# Patient Record
Sex: Male | Born: 1980 | Race: White | Hispanic: No | Marital: Married | Smoking: Current every day smoker
Health system: Southern US, Community
[De-identification: ages and names within clinical notes are randomized; demographics above are authoritative.]

## PROBLEM LIST (undated history)

## (undated) DIAGNOSIS — I10 Essential (primary) hypertension: Secondary | ICD-10-CM

## (undated) DIAGNOSIS — B019 Varicella without complication: Secondary | ICD-10-CM

## (undated) HISTORY — DX: Varicella without complication: B01.9

## (undated) HISTORY — PX: NO PAST SURGERIES: SHX2092

---

## 2016-01-17 ENCOUNTER — Encounter: Payer: Self-pay | Admitting: Emergency Medicine

## 2016-01-17 ENCOUNTER — Emergency Department
Admission: EM | Admit: 2016-01-17 | Discharge: 2016-01-17 | Disposition: A | Discharge status: Home / Self Care | Payer: 59 | Attending: Emergency Medicine | Admitting: Emergency Medicine

## 2016-01-17 ENCOUNTER — Emergency Department: Payer: 59

## 2016-01-17 DIAGNOSIS — R0789 Other chest pain: Secondary | ICD-10-CM | POA: Diagnosis present

## 2016-01-17 DIAGNOSIS — R079 Chest pain, unspecified: Secondary | ICD-10-CM | POA: Diagnosis not present

## 2016-01-17 DIAGNOSIS — I10 Essential (primary) hypertension: Secondary | ICD-10-CM | POA: Diagnosis not present

## 2016-01-17 DIAGNOSIS — Z87891 Personal history of nicotine dependence: Secondary | ICD-10-CM | POA: Diagnosis not present

## 2016-01-17 HISTORY — DX: Essential (primary) hypertension: I10

## 2016-01-17 LAB — COMPREHENSIVE METABOLIC PANEL
ALT: 29 U/L (ref 17–63)
AST: 26 U/L (ref 15–41)
Albumin: 3.9 g/dL (ref 3.5–5.0)
Alkaline Phosphatase: 60 U/L (ref 38–126)
Anion gap: 7 (ref 5–15)
BUN: 11 mg/dL (ref 6–20)
CHLORIDE: 105 mmol/L (ref 101–111)
CO2: 25 mmol/L (ref 22–32)
CREATININE: 1.06 mg/dL (ref 0.61–1.24)
Calcium: 9.1 mg/dL (ref 8.9–10.3)
GFR calc Af Amer: >60 mL/min (ref >60)
GFR calc non Af Amer: >60 mL/min (ref >60)
GLUCOSE: 96 mg/dL (ref 65–99)
Potassium: 3.9 mmol/L (ref 3.5–5.1)
SODIUM: 137 mmol/L (ref 135–145)
Total Bilirubin: 0.5 mg/dL (ref 0.3–1.2)
Total Protein: 7.8 g/dL (ref 6.5–8.1)

## 2016-01-17 LAB — CBC
HCT: 45.7 % (ref 40.0–52.0)
Hemoglobin: 15.8 g/dL (ref 13.0–18.0)
MCH: 28.5 pg (ref 26.0–34.0)
MCHC: 34.5 g/dL (ref 32.0–36.0)
MCV: 82.5 fL (ref 80.0–100.0)
Platelets: 286 10*3/uL (ref 150–440)
RBC: 5.53 MIL/uL (ref 4.40–5.90)
RDW: 13.8 % (ref 11.5–14.5)
WBC: 10.4 10*3/uL (ref 3.8–10.6)

## 2016-01-17 LAB — TROPONIN I
Troponin I: <0.03 ng/mL (ref <0.03)
Troponin I: <0.03 ng/mL (ref <0.03)

## 2016-01-17 MED ORDER — SODIUM CHLORIDE 0.9 % IV BOLUS (SEPSIS)
1000.0000 mL | Freq: Once | INTRAVENOUS | Status: AC
  Administered 2016-01-17: 1000 mL via INTRAVENOUS

## 2016-01-17 NOTE — ED Notes (Signed)
Dr. Brown in with pt.

## 2016-01-17 NOTE — ED Provider Notes (Signed)
[facility] Emergency Department Provider Note    First MD Initiated Contact with Patient 01/17/16 1249     (approximate)  I have reviewed the triage vital signs and the nursing notes.   HISTORY  Chief Complaint Chest Pain  HPI Victor Cuevas is a 35 y.o. male no past medical history presents to the emergency department with chest pressure with radiation to left arm today. Patient states that his initial pain score was proximal my 7 out of 10 but following submental nitroglycerin was decreased to a 2 right now. Patient is also given aspirin 324 mg by EMS. Patient also admits to nausea and one episode of nonbloody diarrhea.  Past Medical History:  Diagnosis Date  . Hypertension     There are no active problems to display for this patient.   History reviewed. No pertinent surgical history.  Prior to Admission medications   Not on File    Allergies Patient has no known allergies.  No family history on file.  Social History Social History  Substance Use Topics  . Smoking status: Former Research scientist (life sciences)  . Smokeless tobacco: Never Used  . Alcohol use No    Review of Systems Constitutional: No fever/chills Eyes: No visual changes. ENT: No sore throat. Cardiovascular: Positive for chest pain. Respiratory: Denies shortness of breath. Gastrointestinal: No abdominal pain.  No nausea, no vomiting.  No diarrhea.  No constipation. Genitourinary: Negative for dysuria. Musculoskeletal: Negative for back pain. Skin: Negative for rash. Neurological: Negative for headaches, focal weakness or numbness.  10-point ROS otherwise negative.  ____________________________________________   PHYSICAL EXAM:  VITAL SIGNS: ED Triage Vitals  Enc Vitals Group     BP 01/17/16 1152 137/71     Pulse Rate 01/17/16 1152 (!) 113     Resp 01/17/16 1152 20     Temp 01/17/16 1152 98.3 F (36.8 C)     Temp Source 01/17/16 1152 Oral     SpO2 01/17/16 1149 97 %     Weight  01/17/16 1153 275 lb (124.7 kg)     Height 01/17/16 1153 5\' 11"  (1.803 m)     Head Circumference --      Peak Flow --      Pain Score 01/17/16 1153 4     Pain Loc --      Pain Edu? --      Excl. in [place]? --     Constitutional: Alert and oriented. Well appearing and in no acute distress. Eyes: Conjunctivae are normal. PERRL. EOMI. Head: Atraumatic. Nose: No congestion/rhinnorhea. Mouth/Throat: Mucous membranes are moist.  Oropharynx non-erythematous. Neck: No stridor.  No meningeal signs.  Cardiovascular: Normal rate, regular rhythm. Good peripheral circulation. Grossly normal heart sounds. Respiratory: Normal respiratory effort.  No retractions. Lungs CTAB. Gastrointestinal: Soft and nontender. No distention.   Musculoskeletal: No lower extremity tenderness nor edema. No gross deformities of extremities. Neurologic:  Normal speech and language. No gross focal neurologic deficits are appreciated.  Skin:  Skin is warm, dry and intact. No rash noted. Psychiatric: Mood and affect are normal. Speech and behavior are normal.  ____________________________________________   LABS (all labs ordered are listed, but only abnormal results are displayed)  Labs Reviewed  CBC  TROPONIN I  COMPREHENSIVE METABOLIC PANEL   ____________________________________________  EKG  ED ECG REPORT I, [place] N BROWN, the attending physician, personally viewed and interpreted this ECG.   Date: 01/19/2016  EKG Time: 11:45 AM  Rate: 91  Rhythm: Normal sinus rhythm  Axis: Normal  Intervals: Normal  ST&T Change: None  ____________________________________________  RADIOLOGY I, [place] N BROWN, personally viewed and evaluated these images (plain radiographs) as part of my medical decision making, as well as reviewing the written report by the radiologist.  Dg Chest 2 View  Result Date: 01/17/2016 CLINICAL DATA:  Chest pressure since last night. No shortness of breath. Symptoms associated with  some nausea. Former smoker. EXAM: CHEST  2 VIEW COMPARISON:  None in PACs FINDINGS: The lungs are well-expanded and clear. The heart and pulmonary vascularity are normal. The mediastinum is normal in width the retrosternal soft tissues are normal. There is no pleural effusion. The trachea is midline. The bony thorax exhibits no acute abnormality. IMPRESSION: There is no active cardiopulmonary disease. Electronically Signed   By: David  Martinique M.D.   On: 01/17/2016 13:15     Procedures    INITIAL IMPRESSION / ASSESSMENT AND PLAN / ED COURSE  Pertinent labs & imaging results that were available during my care of the patient were reviewed by me and considered in my medical decision making (see chart for details).     Clinical Course    Patient presenting with chest pain which she believes may be secondary to "stress". EKG revealed no evidence of ischemia or infarction troponin negative. Will obtain a second troponin. Patient's care transferred to Dr. Goodman____________________________________________  FINAL CLINICAL IMPRESSION(S) / ED DIAGNOSES  Final diagnoses:  Chest pain, unspecified type     MEDICATIONS GIVEN DURING THIS VISIT:  Medications - No data to display   NEW OUTPATIENT MEDICATIONS STARTED DURING THIS VISIT:  New Prescriptions   No medications on file    Modified Medications   No medications on file    Discontinued Medications   No medications on file     Note:  This document was prepared using Dragon voice recognition software and may include unintentional dictation errors.    Gregor Hams, MD 01/19/16 [phone]

## 2016-01-17 NOTE — ED Triage Notes (Signed)
Patient to ER via EMS from home for c/o chest pressure that began last night. Patient was driving when nausea began, followed by central chest pain. Denies diaphoresis, dizziness, or HA. Patient was given 324mg  ASA and 2 SL nitro via EMS. Patient's systolic pressure was in [id number] prior to nitro. Patient reports some relief from nitro (was 6-7/10 on pain scale, now 4-5 on pain scale). Patient denies any cardiac history or any family history at young age.

## 2016-01-17 NOTE — ED Notes (Signed)
See triage note  He was brought to ED via ems with some chest discomfort   States chest discomfort was in central chest following some nausea while driving

## 2016-01-17 NOTE — ED Notes (Signed)
Resting quietly at present  Awaiting

## 2016-04-14 ENCOUNTER — Encounter: Payer: Self-pay | Admitting: Family Medicine

## 2016-04-14 ENCOUNTER — Ambulatory Visit (INDEPENDENT_AMBULATORY_CARE_PROVIDER_SITE_OTHER): Payer: BLUE CROSS/BLUE SHIELD | Admitting: Family Medicine

## 2016-04-14 VITALS — BP 108/78 | HR 68 | Temp 97.6°F | Ht 71.0 in | Wt 225.0 lb

## 2016-04-14 DIAGNOSIS — R200 Anesthesia of skin: Secondary | ICD-10-CM | POA: Diagnosis not present

## 2016-04-14 DIAGNOSIS — R1013 Epigastric pain: Secondary | ICD-10-CM | POA: Diagnosis not present

## 2016-04-14 DIAGNOSIS — R109 Unspecified abdominal pain: Secondary | ICD-10-CM | POA: Diagnosis not present

## 2016-04-14 DIAGNOSIS — R634 Abnormal weight loss: Secondary | ICD-10-CM | POA: Diagnosis not present

## 2016-04-14 LAB — COMPREHENSIVE METABOLIC PANEL
ALK PHOS: 75 U/L (ref 39–117)
ALT: 13 U/L (ref 0–53)
AST: 13 U/L (ref 0–37)
Albumin: 4.1 g/dL (ref 3.5–5.2)
BUN: 11 mg/dL (ref 6–23)
CALCIUM: 9.5 mg/dL (ref 8.4–10.5)
CO2: 26 meq/L (ref 19–32)
Chloride: 104 mEq/L (ref 96–112)
Creatinine, Ser: 1.04 mg/dL (ref 0.40–1.50)
GFR: 86.88 mL/min (ref >60.00)
GLUCOSE: 81 mg/dL (ref 70–99)
POTASSIUM: 4.3 meq/L (ref 3.5–5.1)
Sodium: 139 mEq/L (ref 135–145)
Total Bilirubin: 0.5 mg/dL (ref 0.2–1.2)
Total Protein: 7.2 g/dL (ref 6.0–8.3)

## 2016-04-14 LAB — POCT URINALYSIS DIPSTICK
Bilirubin, UA: NEGATIVE
Glucose, UA: NEGATIVE
KETONES UA: NEGATIVE
Leukocytes, UA: NEGATIVE
Nitrite, UA: NEGATIVE
PH UA: 6
PROTEIN UA: 15
Urobilinogen, UA: 0.2

## 2016-04-14 LAB — TSH: TSH: 2.91 u[IU]/mL (ref 0.35–4.50)

## 2016-04-14 LAB — URINALYSIS, MICROSCOPIC ONLY: WBC, UA: NONE SEEN (ref 0–?)

## 2016-04-14 LAB — HEMOGLOBIN A1C: HEMOGLOBIN A1C: 5.4 % (ref 4.6–6.5)

## 2016-04-14 LAB — CBC
HEMATOCRIT: 46.3 % (ref 39.0–52.0)
HEMOGLOBIN: 15.6 g/dL (ref 13.0–17.0)
MCHC: 33.7 g/dL (ref 30.0–36.0)
MCV: 85.6 fl (ref 78.0–100.0)
Platelets: 238 10*3/uL (ref 150.0–400.0)
RBC: 5.42 Mil/uL (ref 4.22–5.81)
RDW: 13.5 % (ref 11.5–15.5)
WBC: 6.3 10*3/uL (ref 4.0–10.5)

## 2016-04-14 LAB — SEDIMENTATION RATE: SED RATE: 36 mm/h — AB (ref 0–15)

## 2016-04-14 LAB — LIPASE: LIPASE: 42 U/L (ref 11.0–59.0)

## 2016-04-14 NOTE — Patient Instructions (Signed)
Nice to meet you. We are going to obtain some lab work and send your urine for culture and microscopy. Once the lab work returns we'll have a better answer as how to proceed regarding your pain. If you develop worsening pain, persistent numbness, weakness, blood in your stool, fevers, or any new or changing symptoms please seek medical attention immediately.

## 2016-04-14 NOTE — Progress Notes (Signed)
Pre visit review using our clinic review tool, if applicable. No additional management support is needed unless otherwise documented below in the visit note. 

## 2016-04-14 NOTE — Progress Notes (Signed)
Victor Rumps, MD Phone: [phone]  Victor Cuevas is a 36 y.o. male who presents today for new patient visit.  Patient reports over the last 3-4 months he has had abdominal pain that has now localized to his right flank. He notes initially he had some chest pain with this and was evaluated in the emergency room. They ruled out MI there. He's not had any recurrence of the chest pain. He notes no radiation of the flank pain. He notes no numbness or weakness in his lower extremities. Does note his left upper extremity falls asleep while he is sleeping at times. Resolves with change in position. Has no numbness at any other time. He notes no dysuria, frequency, or urgency of urine. 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 He notes intermittent diarrhea with no blood in his stool. Has not had any nausea in the last 3-4 months. No vomiting. The flank pain and abdominal pain occurs typically after eating. Sometimes gets pain across the epigastric region of the stomach. It is somewhere between sharp and stabbing pain and dull pain. Last for about 30 minutes and goes away on its own. He notes he has lost about 50 pounds not necessarily unintentionally though he did change his diet significantly given the pain issues that he's had. He notes no night sweats. His father does have a history of kidney stones.  Active Ambulatory Problems    Diagnosis Date Noted  . Right flank pain 04/14/2016  . Left arm numbness 04/14/2016   Resolved Ambulatory Problems    Diagnosis Date Noted  . No Resolved Ambulatory Problems   Past Medical History:  Diagnosis Date  . Chicken pox   . Hypertension     Family History  Problem Relation Age of Onset  . Hypertension Father   . Diabetes Father   . Hypertension Maternal Grandfather   . Diabetes Maternal Grandfather     Social History   Social History  . Marital status: Single    Spouse name: N/A  . Number of children: N/A  . Years of education: N/A     Occupational History  . Not on file.   Social History Main Topics  . Smoking status: Former Research scientist (life sciences)  . Smokeless tobacco: Never Used  . Alcohol use No  . Drug use: No  . Sexual activity: Not on file   Other Topics Concern  . Not on file   Social History Narrative  . No narrative on file    ROS see HPI  Objective  Physical Exam Vitals:   04/14/16 0818 04/14/16 0901  BP: (!) 124/94 108/78  Pulse: 68   Temp: 97.6 F (36.4 C)     BP Readings from Last 3 Encounters:  04/14/16 108/78  01/17/16 135/81   Wt Readings from Last 3 Encounters:  04/14/16 225 lb (102.1 kg)  01/17/16 275 lb (124.7 kg)    Physical Exam  Constitutional: No distress.  HENT:  Head: Normocephalic and atraumatic.  Mouth/Throat: Oropharynx is clear and moist. No oropharyngeal exudate.  Eyes: Conjunctivae are normal. Pupils are equal, round, and reactive to light.  Cardiovascular: Normal rate, regular rhythm and normal heart sounds.   Pulmonary/Chest: Effort normal and breath sounds normal.  Abdominal: Soft. Bowel sounds are normal. He exhibits no distension. There is tenderness (mild epigastric tenderness). There is no rebound and no guarding.  Negative Murphy's sign  Musculoskeletal: He exhibits no edema.  Neurological: He is alert. Gait normal.  CN 2-12 intact, 5/5 strength in  bilateral biceps, triceps, grip, quads, hamstrings, plantar and dorsiflexion, sensation to light touch intact in bilateral UE and LE, normal gait, 2+ patellar reflexes  Skin: Skin is warm and dry. He is not diaphoretic.     Assessment/Plan:   Right flank pain Patient notes right flank pain that has sometimes been associated with epigastric discomfort. This pain is associated with eating meals. Initially started after having had some abdominal discomfort and chest pain that was evaluated in the emergency room. Also associated with weight loss though potentially this could be related to his change in diet. Given  association with eating this may be related to a gallbladder issue or a gastric ulcer issue. Given location could be kidney stone though this seems less likely given association with food intake. He does have blood on his UA dipstick. We will cast a wide net in evaluation. Lab work will be obtained as outlined below. Send urine for culture and microscopy. He'll monitor and if worsens in any manner seek medical attention immediately.   Left arm numbness Positional left arm numbness only when sleeping. Doesn't occur at any other time. When he gets up it goes away quickly. He is neurologically intact. No neck pain. Discussed this is likely positional from how he is sleeping given his description. He'll continue to monitor and if worsens or changes in any manner he will let us know. Could consider x-ray of his neck if this persists.   Orders Placed This Encounter  Procedures  . Urine Culture  . Urine Microscopic Only  . CBC  . Comp Met (CMET)  . TSH  . HgB A1c  . Sed Rate (ESR)  . Lipase  . POCT Urinalysis Dipstick    No orders of the defined types were placed in this encounter.    Victor Rumps, MD [facility]

## 2016-04-14 NOTE — Assessment & Plan Note (Signed)
Patient notes right flank pain that has sometimes been associated with epigastric discomfort. This pain is associated with eating meals. Initially started after having had some abdominal discomfort and chest pain that was evaluated in the emergency room. Also associated with weight loss though potentially this could be related to his change in diet. Given association with eating this may be related to a gallbladder issue or a gastric ulcer issue. Given location could be kidney stone though this seems less likely given association with food intake. He does have blood on his UA dipstick. We will cast a wide net in evaluation. Lab work will be obtained as outlined below. Send urine for culture and microscopy. He'll monitor and if worsens in any manner seek medical attention immediately.

## 2016-04-14 NOTE — Assessment & Plan Note (Signed)
Positional left arm numbness only when sleeping. Doesn't occur at any other time. When he gets up it goes away quickly. He is neurologically intact. No neck pain. Discussed this is likely positional from how he is sleeping given his description. He'll continue to monitor and if worsens or changes in any manner he will let us know. Could consider x-ray of his neck if this persists.

## 2016-04-16 LAB — URINE CULTURE: ORGANISM ID, BACTERIA: NO GROWTH

## 2016-04-18 ENCOUNTER — Telehealth: Payer: Self-pay

## 2016-04-18 DIAGNOSIS — R3129 Other microscopic hematuria: Secondary | ICD-10-CM

## 2016-04-18 DIAGNOSIS — G8929 Other chronic pain: Secondary | ICD-10-CM

## 2016-04-18 DIAGNOSIS — R1013 Epigastric pain: Secondary | ICD-10-CM

## 2016-04-18 NOTE — Telephone Encounter (Signed)
-----   Message from Leone Haven, MD sent at 04/18/2016  1:40 PM EST ----- Please let the patient know that his lab work revealed a mildly elevated sedimentation rate though otherwise was unremarkable. His urine did have red blood cells in it and I would suggest referral to urology for that. Given the chronicity of his abdominal pain I would suggest evaluation by GI as well. If he is willing I can place these referrals. Thanks.

## 2016-04-18 NOTE — Telephone Encounter (Signed)
lmtrc

## 2016-04-19 NOTE — Telephone Encounter (Signed)
Patient advised of below and verbalized understanding.  He is willing to go to urology and GI please place order for referrals. Thanks

## 2016-04-19 NOTE — Telephone Encounter (Signed)
Referrals placed 

## 2016-05-01 ENCOUNTER — Ambulatory Visit: Payer: Self-pay | Admitting: Urology

## 2016-05-02 NOTE — Progress Notes (Signed)
05/03/2016 4:27 PM   Silver Huguenin 1980-02-25 [id number]  Referring provider: Leone Haven, MD [address] [place], [place] [address]  Chief Complaint  Patient presents with  . New Patient (Initial Visit)    microscopic hematuria referred by Dr. Charlesetta Shanks    HPI: Patient is a 36 -year-old Caucasian male who presents today as a referral from their PCP, Dr. Caryl Bis, for microscopic hematuria.    Patient was found to have microscopic hematuria on 04/19/2016 with 3-6 RBC's/hpf and negative urine culture.  Patient does have a prior history of microscopic hematuria, but he did not follow up.    He does not have a prior history of recurrent urinary tract infections, nephrolithiasis, trauma to the genitourinary tract, BPH or malignancies of the genitourinary tract.   He does have a family medical history of nephrolithiasis with his dad and brother having stones.  He has not had malignancies of the genitourinary tract or hematuria.   Today, she is not having symptoms of frequent urination, urgency, dysuria, nocturia, incontinence, hesitancy, intermittency, straining to urinate or a weak urinary stream.  His UA today is unremarkable.   He is not experiencing any suprapubic pain, but he is experiencing right upper quadrant pain.  He denies any recent fevers, chills, nausea or vomiting.   He has not had any recent imaging studies.   He is a former smoker, with a 2 ppd history.  Quit 10 years ago.  He is not exposed to secondhand smoke.  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   His BMI is 30.52.     PMH: Past Medical History:  Diagnosis Date  . Chicken pox   . Hypertension     Surgical History: History reviewed. No pertinent surgical history.  Home Medications:  Allergies as of 05/03/2016   No Known Allergies     Medication List    as of 05/03/2016  4:27 PM   You have not been prescribed any medications.     Allergies: No Known Allergies  Family  History: Family History  Problem Relation Age of Onset  . Hypertension Father   . Diabetes Father   . Hypertension Maternal Grandfather   . Diabetes Maternal Grandfather   . Lymphoma Mother   . Prostate cancer Neg Hx   . Kidney cancer Neg Hx   . Bladder Cancer Neg Hx     Social History:  reports that he has quit smoking. He has never used smokeless tobacco. He reports that he does not drink alcohol or use drugs.  ROS: UROLOGY Frequent Urination?: No Hard to postpone urination?: No Burning/pain with urination?: No Get up at night to urinate?: No Leakage of urine?: No Urine stream starts and stops?: No Trouble starting stream?: No Do you have to strain to urinate?: No Blood in urine?: Yes Urinary tract infection?: No Sexually transmitted disease?: No Injury to kidneys or bladder?: No Painful intercourse?: No Weak stream?: No Erection problems?: No Penile pain?: No  Gastrointestinal Nausea?: No Vomiting?: No Indigestion/heartburn?: No Diarrhea?: Yes Constipation?: Yes  Constitutional Fever: No Night sweats?: No Weight loss?: Yes Fatigue?: Yes  Skin Skin rash/lesions?: No Itching?: No  Eyes Blurred vision?: No Double vision?: No  Ears/Nose/Throat Sore throat?: Yes Sinus problems?: Yes  Hematologic/Lymphatic Swollen glands?: No Easy bruising?: No  Cardiovascular Leg swelling?: No Chest pain?: No  Respiratory Cough?: No Shortness of breath?: No  Endocrine Excessive thirst?: No  Musculoskeletal Back pain?: Yes Joint pain?: No  Neurological Headaches?: No  Dizziness?: No  Psychologic Depression?: No Anxiety?: No  Physical Exam: BP 118/81   Pulse (!) 57   Ht 5\' 11"  (1.803 m)   Wt 218 lb 12.8 oz (99.2 kg)   BMI 30.52 kg/m   Constitutional: Well nourished. Alert and oriented, No acute distress. HEENT: [place] AT, moist mucus membranes. Trachea midline, no masses. Cardiovascular: No clubbing, cyanosis, or edema. Respiratory: Normal  respiratory effort, no increased work of breathing. GI: Abdomen is soft, non tender, non distended, no abdominal masses. Liver and spleen not palpable.  No hernias appreciated.  Stool sample for occult testing is not indicated.   GU: No CVA tenderness.  No bladder fullness or masses.  Patient with circumcised phallus.   Urethral meatus is patent.  No penile discharge. No penile lesions or rashes. Scrotum without lesions, cysts, rashes and/or edema.  Testicles are located scrotally bilaterally. No masses are appreciated in the testicles. Left and right epididymis are normal. Rectal: Patient with  normal sphincter tone. Anus and perineum without scarring or rashes. No rectal masses are appreciated. Prostate is approximately 45 grams, no nodules are appreciated. Seminal vesicles are normal. Skin: No rashes, bruises or suspicious lesions. Lymph: No cervical or inguinal adenopathy. Neurologic: Grossly intact, no focal deficits, moving all 4 extremities. Psychiatric: Normal mood and affect.  Laboratory Data: Lab Results  Component Value Date   WBC 6.3 04/14/2016   HGB 15.6 04/14/2016   HCT 46.3 04/14/2016   MCV 85.6 04/14/2016   PLT 238.0 04/14/2016    Lab Results  Component Value Date   CREATININE 1.04 04/14/2016     Lab Results  Component Value Date   HGBA1C 5.4 04/14/2016    Lab Results  Component Value Date   TSH 2.91 04/14/2016     Lab Results  Component Value Date   AST 13 04/14/2016   Lab Results  Component Value Date   ALT 13 04/14/2016      Urinalysis Unremarkable.  See EPIC.    Assessment & Plan:    1. Microscopic hematuria  - I explained to the patient that there are a number of causes that can be associated with blood in the urine, such as stones,  BPH, UTI's, damage to the urinary tract and/or cancer.  - At this time, I felt that the patient warranted further urologic evaluation.   The AUA guidelines state that a CT urogram is the preferred imaging study  to evaluate hematuria.  - I explained to the patient that a contrast material will be injected into a vein and that in rare instances, an allergic reaction can result and may even life threatening   The patient denies any allergies to contrast, iodine and/or seafood and is not taking metformin.  - Following the imaging study,  I've recommended a cystoscopy. I described how this is performed, typically in an office setting with a flexible cystoscope. We described the risks, benefits, and possible side effects, the most common of which is a minor amount of blood in the urine and/or burning which usually resolves in 24 to 48 hours.    - The patient had the opportunity to ask questions which were answered. Based upon this discussion, the patient is willing to proceed. Therefore, I've ordered: a CT Urogram and cystoscopy.  - The patient will return following all of the above for discussion of the results.   - UA  - Urine culture  - BUN + creatinine    Return for CT Urogram report and cystoscopy.  These notes generated with voice recognition software. I apologize for typographical errors.  Zara Council, [facility] [address], [place], [place] [address] [phone]

## 2016-05-03 ENCOUNTER — Encounter: Payer: Self-pay | Admitting: Urology

## 2016-05-03 ENCOUNTER — Ambulatory Visit: Payer: BLUE CROSS/BLUE SHIELD | Admitting: Urology

## 2016-05-03 VITALS — BP 118/81 | HR 57 | Ht 71.0 in | Wt 218.8 lb

## 2016-05-03 DIAGNOSIS — R3129 Other microscopic hematuria: Secondary | ICD-10-CM | POA: Diagnosis not present

## 2016-05-03 LAB — URINALYSIS, COMPLETE
BILIRUBIN UA: NEGATIVE
Glucose, UA: NEGATIVE
Leukocytes, UA: NEGATIVE
NITRITE UA: NEGATIVE
Urobilinogen, Ur: 0.2 mg/dL (ref 0.2–1.0)
pH, UA: 6.5 (ref 5.0–7.5)

## 2016-05-03 LAB — MICROSCOPIC EXAMINATION
Bacteria, UA: NONE SEEN
EPITHELIAL CELLS (NON RENAL): NONE SEEN /HPF (ref 0–10)
WBC, UA: NONE SEEN /hpf (ref 0–?)

## 2016-05-03 NOTE — Patient Instructions (Addendum)
Hematuria, Adult Hematuria is blood in your urine. It can be caused by a bladder infection, kidney infection, prostate infection, kidney stone, or cancer of your urinary tract. Infections can usually be treated with medicine, and a kidney stone usually will pass through your urine. If neither of these is the cause of your hematuria, further workup to find out the reason may be needed. It is very important that you tell your health care provider about any blood you see in your urine, even if the blood stops without treatment or happens without causing pain. Blood in your urine that happens and then stops and then happens again can be a symptom of a very serious condition. Also, pain is not a symptom in the initial stages of many urinary cancers. Follow these instructions at home:  Drink lots of fluid, 3-4 quarts a day. If you have been diagnosed with an infection, cranberry juice is especially recommended, in addition to large amounts of water.  Avoid caffeine, tea, and carbonated beverages because they tend to irritate the bladder.  Avoid alcohol because it may irritate the prostate.  Take all medicines as directed by your health care provider.  If you were prescribed an antibiotic medicine, finish it all even if you start to feel better.  If you have been diagnosed with a kidney stone, follow your health care provider's instructions regarding straining your urine to catch the stone.  Empty your bladder often. Avoid holding urine for long periods of time.  After a bowel movement, women should cleanse front to back. Use each tissue only once.  Empty your bladder before and after sexual intercourse if you are a male. Contact a health care provider if:  You develop back pain.  You have a fever.  You have a feeling of sickness in your stomach (nausea) or vomiting.  Your symptoms are not better in 3 days. Return sooner if you are getting worse. Get help right away if:  You develop  severe vomiting and are unable to keep the medicine down.  You develop severe back or abdominal pain despite taking your medicines.  You begin passing a large amount of blood or clots in your urine.  You feel extremely weak or faint, or you pass out. This information is not intended to replace advice given to you by your health care provider. Make sure you discuss any questions you have with your health care provider. Document Released: 02/06/2005 Document Revised: 07/15/2015 Document Reviewed: 10/07/2012 Elsevier Interactive Patient Education  2017 [employer]  CT Scan A computed tomography (CT) scan is a specialized X-ray scan. It uses X-rays and a computer to make pictures of different areas of your body. A CT scan can offer more detailed information than a regular X-ray exam. The CT scan provides data about internal organs, soft tissue structures, blood vessels, and bones. The CT scanner is a large machine that takes pictures of your body as you move through the opening. Tell a health care provider about:  Any allergies you have.  All medicines you are taking, including vitamins, herbs, eye drops, creams, and over-the-counter medicines.  Any problems you or family members have had with anesthetic medicines.  Any blood disorders you have.  Any surgeries you have had.  Any medical conditions you have. What are the risks? Generally, this is a safe procedure. However, as with any procedure, problems can occur. Possible problems include:  An allergic reaction to the contrast material.  Development of cancer from excessive exposure to   radiation. The risk of this is small.  What happens before the procedure?  The day before the test, stop drinking caffeinated beverages. These include energy drinks, tea, soda, coffee, and hot chocolate.  On the day of the test: ? About 4 hours before the test, stop eating and drinking anything but water as advised by your health care  provider. ? Avoid wearing jewelry. You will have to partly or fully undress and wear a hospital gown. What happens during the procedure?  You will be asked to lie on a table with your arms above your head.  If contrast dye is to be used for the test, an IV tube will be inserted in your arm. The contrast dye will be injected into the IV tube. You might feel warm, or you may get a metallic taste in your mouth.  The table you will be lying on will move into a large machine that will do the scanning.  You will be able to see, hear, and talk to the person running the machine while you are in it. Follow that person's directions.  The CT machine will move around you to take pictures. Do not move while it is scanning. This helps to get a good image.  When the best possible pictures have been taken, the machine will be turned off. The table will be moved out of the machine. The IV tube will then be removed. What happens after the procedure? Ask your health care provider when to follow up for your test results. This information is not intended to replace advice given to you by your health care provider. Make sure you discuss any questions you have with your health care provider. Document Released: 03/16/2004 Document Revised: 07/15/2015 Document Reviewed: 10/14/2012 Elsevier Interactive Patient Education  2017 [employer]  Cystoscopy Cystoscopy is a procedure that is used to help diagnose and sometimes treat conditions that affect that lower urinary tract. The lower urinary tract includes the bladder and the tube that drains urine from the bladder out of the body (urethra). Cystoscopy is performed with a thin, tube-shaped instrument with a light and camera at the end (cystoscope). The cystoscope may be hard (rigid) or flexible, depending on the goal of the procedure.The cystoscope is inserted through the urethra, into the bladder. Cystoscopy may be recommended if you have:  Urinary tractinfections  that keep coming back (recurring).  Blood in the urine (hematuria).  Loss of bladder control (urinary incontinence) or an overactive bladder.  Unusual cells found in a urine sample.  A blockage in the urethra.  Painful urination.  An abnormality in the bladder found during an intravenous pyelogram (IVP) or CT scan.  Cystoscopy may also be done to remove a sample of tissue to be examined under a microscope (biopsy). Tell a health care provider about:  Any allergies you have.  All medicines you are taking, including vitamins, herbs, eye drops, creams, and over-the-counter medicines.  Any problems you or family members have had with anesthetic medicines.  Any blood disorders you have.  Any surgeries you have had.  Any medical conditions you have.  Whether you are pregnant or may be pregnant. What are the risks? Generally, this is a safe procedure. However, problems may occur, including:  Infection.  Bleeding.  Allergic reactions to medicines.  Damage to other structures or organs.  What happens before the procedure?  Ask your health care provider about: ? Changing or stopping your regular medicines. This is especially important if you are taking   diabetes medicines or blood thinners. ? Taking medicines such as aspirin and ibuprofen. These medicines can thin your blood. Do not take these medicines before your procedure if your health care provider instructs you not to.  Follow instructions from your health care provider about eating or drinking restrictions.  You may be given antibiotic medicine to help prevent infection.  You may have an exam or testing, such as X-rays of the bladder, urethra, or kidneys.  You may have urine tests to check for signs of infection.  Plan to have someone take you home after the procedure. What happens during the procedure?  To reduce your risk of infection,your health care team will wash or sanitize their hands.  You will be  given one or more of the following: ? A medicine to help you relax (sedative). ? A medicine to numb the area (local anesthetic).  The area around the opening of your urethra will be cleaned.  The cystoscope will be passed through your urethra into your bladder.  Germ-free (sterile)fluid will flow through the cystoscope to fill your bladder. The fluid will stretch your bladder so that your surgeon can clearly examine your bladder walls.  The cystoscope will be removed and your bladder will be emptied. The procedure may vary among health care providers and hospitals. What happens after the procedure?  You may have some soreness or pain in your abdomen and urethra. Medicines will be available to help you.  You may have some blood in your urine.  Do not drive for 24 hours if you received a sedative. This information is not intended to replace advice given to you by your health care provider. Make sure you discuss any questions you have with your health care provider. Document Released: 02/04/2000 Document Revised: 06/17/2015 Document Reviewed: 12/24/2014 Elsevier Interactive Patient Education  2017 [employer] Inc.  

## 2016-05-04 LAB — BUN+CREAT
BUN / CREAT RATIO: 18 (ref 9–20)
BUN: 18 mg/dL (ref 6–20)
Creatinine, Ser: 1.02 mg/dL (ref 0.76–1.27)
GFR calc non Af Amer: 95 mL/min/{1.73_m2} (ref >59)
GFR, EST AFRICAN AMERICAN: 110 mL/min/{1.73_m2} (ref >59)

## 2016-05-05 LAB — CULTURE, URINE COMPREHENSIVE

## 2016-05-18 ENCOUNTER — Encounter: Payer: Self-pay | Admitting: Family Medicine

## 2016-05-18 ENCOUNTER — Ambulatory Visit (INDEPENDENT_AMBULATORY_CARE_PROVIDER_SITE_OTHER): Payer: BLUE CROSS/BLUE SHIELD | Admitting: Family Medicine

## 2016-05-18 VITALS — BP 118/70 | HR 74 | Temp 98.2°F | Ht 71.0 in | Wt 214.6 lb

## 2016-05-18 DIAGNOSIS — R109 Unspecified abdominal pain: Secondary | ICD-10-CM | POA: Diagnosis not present

## 2016-05-18 DIAGNOSIS — R3129 Other microscopic hematuria: Secondary | ICD-10-CM

## 2016-05-18 DIAGNOSIS — D229 Melanocytic nevi, unspecified: Secondary | ICD-10-CM

## 2016-05-18 NOTE — Assessment & Plan Note (Signed)
Patient with numerous nevi scattered over his body. Also has a somewhat large pedunculated skin covered nevus versus skin tag in the middle of his back. Given the multiple nevi and the central back nevus we will refer to dermatology for skin evaluation.

## 2016-05-18 NOTE — Progress Notes (Signed)
Tommi Rumps, MD Phone: [phone]  Victor Cuevas is a 36 y.o. male who presents today for follow-up.  Patient notes continued issues with right upper quadrant and right flank pain. Has been going on daily since we last saw each other. Occasionally the epigastric region. Worsened after eating. Has an appointment with GI next week. Notes rare diarrhea with this. No nausea or vomiting. No night sweats. He notes he did start new job that is very physical and thinks that may have contributed somewhat to his further weight loss. He is also seeing urology given his microscopic hematuria. They plan on doing a CT scan though he notes he has not been notified when this is scheduled. Scheduled to go for cystoscopy next week though they wanted the CT scan first. No gross hematuria. Prior lab work evaluation was fairly unremarkable with the exception of slightly elevated sedimentation rate.  Patient additionally notes a mole in the middle of his back Present for about 2 Years and Has Gotten Bigger Progressively. Has Not Grown in Some Time Though. No Bleeding. He notes no other changing nevi.  PMH: Former smoker   ROS see history of present illness  Objective  Physical Exam Vitals:   05/18/16 0800 05/18/16 0818  BP: 140/74 118/70  Pulse: 74   Temp: 98.2 F (36.8 C)     BP Readings from Last 3 Encounters:  05/18/16 118/70  05/03/16 118/81  04/14/16 108/78   Wt Readings from Last 3 Encounters:  05/18/16 214 lb 9.6 oz (97.3 kg)  05/03/16 218 lb 12.8 oz (99.2 kg)  04/14/16 225 lb (102.1 kg)    Physical Exam  Constitutional: No distress.  HENT:  Head: Normocephalic and atraumatic.  Cardiovascular: Normal rate, regular rhythm and normal heart sounds.   Pulmonary/Chest: Effort normal and breath sounds normal.  Abdominal: Soft. Bowel sounds are normal. He exhibits no distension. There is tenderness (mild soreness in the epigastric region). There is no rebound and no guarding.    Musculoskeletal: He exhibits no edema.  Neurological: He is alert. Gait normal.  Skin: Skin is warm and dry. He is not diaphoretic.   about a centimeter large pedunculated skin-colored mole in the middle of his back, numerous normal-appearing nevi on his arms and back   Assessment/Plan: Please see individual problem list.  Right flank pain Patient with continued issues with this. Also some epigastric discomfort. Mild diarrhea. Has had some continued weight loss. That may be contributed to by his physical job. He will see GI next week as planned. Suspect he will need an endoscopy and he will be having a CT scan of his abdomen and pelvis as well through urology. Will also need cystoscopy. He'll complete these evaluations and we'll see where he stands.  Microscopic hematuria Patient with microscopic hematuria noted on UA. Had evaluation with urology did not reveal any hematuria. They're planning on CT scan and cystoscopy. We will give him contact information CT scan set up.  Multiple nevi Patient with numerous nevi scattered over his body. Also has a somewhat large pedunculated skin covered nevus versus skin tag in the middle of his back. Given the multiple nevi and the central back nevus we will refer to dermatology for skin evaluation.   Orders Placed This Encounter  Procedures  . Ambulatory referral to Dermatology    Referral Priority:   Routine    Referral Type:   Consultation    Referral Reason:   Specialty Services Required    Requested Specialty:   Dermatology  Number of Visits Requested:   [place], MD [place]

## 2016-05-18 NOTE — Assessment & Plan Note (Signed)
Patient with continued issues with this. Also some epigastric discomfort. Mild diarrhea. Has had some continued weight loss. That may be contributed to by his physical job. He will see GI next week as planned. Suspect he will need an endoscopy and he will be having a CT scan of his abdomen and pelvis as well through urology. Will also need cystoscopy. He'll complete these evaluations and we'll see where he stands.

## 2016-05-18 NOTE — Progress Notes (Signed)
Pre visit review using our clinic review tool, if applicable. No additional management support is needed unless otherwise documented below in the visit note. 

## 2016-05-18 NOTE — Assessment & Plan Note (Signed)
Patient with microscopic hematuria noted on UA. Had evaluation with urology did not reveal any hematuria. They're planning on CT scan and cystoscopy. We will give him contact information CT scan set up.

## 2016-05-18 NOTE — Patient Instructions (Signed)
Nice to see you. We will provide you the contact information for urology and the location of your GI exam. If you develop worsening abdominal discomfort or any new or changing symptoms please be reevaluated.

## 2016-05-22 ENCOUNTER — Ambulatory Visit
Admission: RE | Admit: 2016-05-22 | Discharge: 2016-05-22 | Disposition: A | Discharge status: Home / Self Care | Payer: BLUE CROSS/BLUE SHIELD | Source: Ambulatory Visit | Attending: Urology | Admitting: Urology

## 2016-05-22 DIAGNOSIS — N200 Calculus of kidney: Secondary | ICD-10-CM | POA: Insufficient documentation

## 2016-05-22 DIAGNOSIS — R3129 Other microscopic hematuria: Secondary | ICD-10-CM | POA: Diagnosis present

## 2016-05-22 DIAGNOSIS — K449 Diaphragmatic hernia without obstruction or gangrene: Secondary | ICD-10-CM | POA: Insufficient documentation

## 2016-05-22 MED ORDER — IOPAMIDOL (ISOVUE-300) INJECTION 61%
125.0000 mL | Freq: Once | INTRAVENOUS | Status: AC | PRN
  Administered 2016-05-22: 125 mL via INTRAVENOUS

## 2016-05-23 ENCOUNTER — Ambulatory Visit: Payer: BLUE CROSS/BLUE SHIELD | Admitting: Urology

## 2016-05-23 ENCOUNTER — Encounter: Payer: Self-pay | Admitting: Urology

## 2016-05-23 VITALS — BP 112/80 | HR 67 | Ht 71.0 in | Wt 212.4 lb

## 2016-05-23 DIAGNOSIS — R3129 Other microscopic hematuria: Secondary | ICD-10-CM

## 2016-05-23 LAB — URINALYSIS, COMPLETE
BILIRUBIN UA: NEGATIVE
GLUCOSE, UA: NEGATIVE
KETONES UA: NEGATIVE
Leukocytes, UA: NEGATIVE
NITRITE UA: NEGATIVE
Specific Gravity, UA: >1.03 — ABNORMAL HIGH (ref 1.005–1.030)
UUROB: 0.2 mg/dL (ref 0.2–1.0)
pH, UA: 5.5 (ref 5.0–7.5)

## 2016-05-23 LAB — MICROSCOPIC EXAMINATION
EPITHELIAL CELLS (NON RENAL): NONE SEEN /HPF (ref 0–10)
WBC UA: NONE SEEN /HPF (ref 0–?)

## 2016-05-23 MED ORDER — CIPROFLOXACIN HCL 500 MG PO TABS
500.0000 mg | ORAL_TABLET | Freq: Once | ORAL | Status: AC
  Administered 2016-05-23: 500 mg via ORAL

## 2016-05-23 MED ORDER — LIDOCAINE HCL 2 % EX GEL
1.0000 "application " | Freq: Once | CUTANEOUS | Status: AC
  Administered 2016-05-23: 1 via URETHRAL

## 2016-05-23 NOTE — Progress Notes (Signed)
   05/23/16  CC: Microscopic hematuria  HPI: The patient is a 36 year old gentleman who presents today for completion of his microscopic hematuria workup. His CT urogram was unremarkable.   There were no vitals taken for this visit. NED. A&Ox3.   No respiratory distress   Abd soft, NT, ND Normal phallus with bilateral descended testicles  Cystoscopy Procedure Note  Patient identification was confirmed, informed consent was obtained, and patient was prepped using Betadine solution.  Lidocaine jelly was administered per urethral meatus.    Preoperative abx where received prior to procedure.     Pre-Procedure: - Inspection reveals a normal caliber ureteral meatus.  Procedure: The flexible cystoscope was introduced without difficulty - No urethral strictures/lesions are present. - Normal prostate  - Normal bladder neck - Bilateral ureteral orifices identified - Bladder mucosa  reveals no ulcers, tumors, or lesions - No bladder stones - No trabeculation  Retroflexion shows no intravesical lobe.   Post-Procedure: - Patient tolerated the procedure well  Assessment/ Plan:  1. Microscopic hematuria -negative workup. Follow-up in one year for repeat urinalysis.

## 2016-05-24 ENCOUNTER — Encounter: Payer: Self-pay | Admitting: Gastroenterology

## 2016-05-24 ENCOUNTER — Ambulatory Visit (INDEPENDENT_AMBULATORY_CARE_PROVIDER_SITE_OTHER): Payer: BLUE CROSS/BLUE SHIELD | Admitting: Gastroenterology

## 2016-05-24 ENCOUNTER — Other Ambulatory Visit
Admission: RE | Admit: 2016-05-24 | Discharge: 2016-05-24 | Disposition: A | Discharge status: Home / Self Care | Payer: BLUE CROSS/BLUE SHIELD | Source: Ambulatory Visit | Attending: Gastroenterology | Admitting: Gastroenterology

## 2016-05-24 VITALS — BP 106/71 | HR 80 | Temp 97.8°F | Ht 71.0 in | Wt 211.0 lb

## 2016-05-24 DIAGNOSIS — R110 Nausea: Secondary | ICD-10-CM

## 2016-05-24 DIAGNOSIS — R1013 Epigastric pain: Secondary | ICD-10-CM

## 2016-05-24 DIAGNOSIS — K900 Celiac disease: Secondary | ICD-10-CM | POA: Insufficient documentation

## 2016-05-24 MED ORDER — OMEPRAZOLE 40 MG PO CPDR: 40.0000 mg | DELAYED_RELEASE_CAPSULE | Freq: Every day | ORAL | 3 refills | Status: DC

## 2016-05-24 NOTE — Progress Notes (Signed)
Gastroenterology Consultation  Referring Provider:     Leone Haven, MD Primary Care Physician:  Tommi Rumps, MD Primary Gastroenterologist:  Dr. Jonathon Bellows  Reason for Consultation:     Abdominal pain         HPI:   Victor Cuevas is a 36 y.o. y/o male referred for consultation & management  by Dr. Tommi Rumps, MD.    He has been referred to see me for abdominal pain. Presently he is being evaluated for microscopic hematuria with urology and underwent a cystoscopy which was normal. Ct abdomen and pelvis with contrast showed a small hiatal hernia, non obstructing renal stone .   CBC , CMP,;lipase  in 03/2016 was normal    Abdominal pain: Onset: since thanksgiving , at that time he went to the hospital , had some diarrhea for a week. Presently no pain but gets on and off throughout the day, 2-3 times a day , each episode lasts 5-30 mins.  Site :rt flank ,and sometimes upper abdomen  Radiation: localized like a band  Severity :1/10-8/10  Nature of pain: like a knife  Aggravating factors: eating but not sure  Relieving factors :not sure  Weight loss: "lot of weight" but has changed his diet  NSAID use: no  PPI use :no  Gall bladder surgery: yes Frequency of bowel movements: everyday , "diarrhea twice a week", usually formed  Change in bowel movements: noticed a change - stopped red meat , no greasy foods. Greasy foods makes him feel nauseous.  Relief with bowel movements: sometimes  Gas/Bloating/Abdominal distension: no  No fevers  No family history of colon cancer . No rectal bleeding.   Past Medical History:  Diagnosis Date  . Chicken pox   . Hypertension     No past surgical history on file.  Prior to Admission medications   Not on File    Family History  Problem Relation Age of Onset  . Hypertension Father   . Diabetes Father   . Hypertension Maternal Grandfather   . Diabetes Maternal Grandfather   . Lymphoma Mother   . Prostate cancer Neg Hx     . Kidney cancer Neg Hx   . Bladder Cancer Neg Hx      Social History  Substance Use Topics  . Smoking status: Former Research scientist (life sciences)  . Smokeless tobacco: Never Used     Comment: quit 11 years   . Alcohol use No    Allergies as of 05/24/2016  . (No Known Allergies)    Review of Systems:    All systems reviewed and negative except where noted in HPI.   Physical Exam:  BP 106/71 (BP Location: Left Arm, Patient Position: Sitting, Cuff Size: Large)   Pulse 80   Temp 97.8 F (36.6 C) (Oral)   Ht 5\' 11"  (1.803 m)   Wt 211 lb (95.7 kg)   BMI 29.43 kg/m  No LMP for male patient. Psych:  Alert and cooperative. Normal mood and affect. General:   Alert,  Well-developed, well-nourished, pleasant and cooperative in NAD Head:  Normocephalic and atraumatic. Eyes:  Sclera clear, no icterus.   Conjunctiva pink. Ears:  Normal auditory acuity. Nose:  No deformity, discharge, or lesions. Mouth:  No deformity or lesions,oropharynx pink & moist. Neck:  Supple; no masses or thyromegaly. Lungs:  Respirations even and unlabored.  Clear throughout to auscultation.   No wheezes, crackles, or rhonchi. No acute distress. Heart:  Regular rate and rhythm; no murmurs, clicks, rubs, or  gallops. Abdomen:  Normal bowel sounds.  No bruits.  Soft, non-tender and non-distended without masses, hepatosplenomegaly or hernias noted.  No guarding or rebound tenderness.    Msk:  Symmetrical without gross deformities. Good, equal movement & strength bilaterally.. Extremities:  No clubbing or edema.  No cyanosis. Neurologic:  Alert and oriented x3;  grossly normal neurologically. Psych:  Alert and cooperative. Normal mood and affect.  Imaging Studies: Ct Hematuria Workup  Result Date: 05/22/2016 CLINICAL DATA:  Microhematuria. Intermittent right flank/ right groin pain for several months. EXAM: CT ABDOMEN AND PELVIS WITHOUT AND WITH CONTRAST TECHNIQUE: Multidetector CT imaging of the abdomen and pelvis was performed  following the standard protocol before and following the bolus administration of intravenous contrast. CONTRAST:  145mL ISOVUE-300 IOPAMIDOL (ISOVUE-300) INJECTION 61% COMPARISON:  None. FINDINGS: Lower chest: No significant pulmonary nodules or acute consolidative airspace disease. Hepatobiliary: Normal liver with no liver mass. Normal gallbladder with no radiopaque cholelithiasis. No biliary ductal dilatation. Pancreas: Normal, with no mass or duct dilation. Spleen: Normal size. No mass. Adrenals/Urinary Tract: Normal adrenals. Punctate nonobstructing 1 mm interpolar right renal stone. No additional renal stones. No hydronephrosis. Normal caliber ureters, with no ureteral stones. Symmetric normal contrast nephrograms. Sub 5 mm hypodense renal cortical lesion in the posterior interpolar right kidney, too small to characterize, for which no further follow-up is required. No additional renal cortical lesions. On delayed imaging, there is no urothelial wall thickening and there are no filling defects in the opacified portions of the bilateral collecting systems or ureters, noting nondiagnostic evaluation of the pelvic segment of the right ureter due to non opacification. No bladder stones, wall thickening, masses or diverticula. Stomach/Bowel: Small sliding hiatal hernia. Otherwise grossly normal stomach. Normal caliber small bowel with no small bowel wall thickening. Normal appendix. Normal large bowel with no diverticulosis, large bowel wall thickening or pericolonic fat stranding. Vascular/Lymphatic: Normal caliber abdominal aorta. Patent portal, splenic, hepatic and renal veins. No pathologically enlarged lymph nodes in the abdomen or pelvis. Reproductive: Normal size prostate. Other: No pneumoperitoneum, ascites or focal fluid collection. Musculoskeletal: No aggressive appearing focal osseous lesions. Mild thoracolumbar spondylosis. IMPRESSION: 1. Solitary punctate nonobstructing right renal stone. No  hydronephrosis. No ureteral or bladder stones. 2. No suspicious renal cortical masses. No urothelial lesions, with limitations as described. 3. Small sliding hiatal hernia. Electronically Signed   By: Ilona Sorrel M.D.   On: 05/22/2016 16:32    Assessment and Plan:   Neziah Braley is a 36 y.o. y/o male has been referred for abdominal pain. History does not clearly suggest a particular issue. Does have some features of biliary colic .I will obtain a RUQ usg to rule out gall stones , if negative then will consider EGD/colonoscopy/HIDA scan . I will also get a stool Hpylori, celiac disease panel and start him on a PPI.    Follow up in 8 weeks  Dr Jonathon Bellows MD

## 2016-05-26 LAB — CELIAC DISEASE PANEL
ENDOMYSIAL ANTIBODY IGA: NEGATIVE
IgA: 498 mg/dL — ABNORMAL HIGH (ref 90–386)
Tissue Transglutaminase Ab, IgA: <2 U/mL (ref 0–3)

## 2016-05-31 ENCOUNTER — Ambulatory Visit: Payer: BLUE CROSS/BLUE SHIELD

## 2016-06-02 ENCOUNTER — Ambulatory Visit
Admission: RE | Admit: 2016-06-02 | Discharge: 2016-06-02 | Disposition: A | Discharge status: Home / Self Care | Payer: BLUE CROSS/BLUE SHIELD | Source: Ambulatory Visit | Attending: Gastroenterology | Admitting: Gastroenterology

## 2016-06-02 ENCOUNTER — Telehealth: Payer: Self-pay

## 2016-06-02 DIAGNOSIS — K8020 Calculus of gallbladder without cholecystitis without obstruction: Secondary | ICD-10-CM | POA: Insufficient documentation

## 2016-06-02 DIAGNOSIS — R1013 Epigastric pain: Secondary | ICD-10-CM

## 2016-06-02 NOTE — Telephone Encounter (Signed)
Advised pt negative celiac panel per Dr. Vicente Males.   Waiting for scan results to determine if EGD or other procedure is required.

## 2016-06-06 ENCOUNTER — Other Ambulatory Visit: Payer: Self-pay

## 2016-06-06 DIAGNOSIS — K8011 Calculus of gallbladder with chronic cholecystitis with obstruction: Secondary | ICD-10-CM

## 2016-06-15 ENCOUNTER — Ambulatory Visit (INDEPENDENT_AMBULATORY_CARE_PROVIDER_SITE_OTHER): Payer: BLUE CROSS/BLUE SHIELD | Admitting: General Surgery

## 2016-06-15 ENCOUNTER — Encounter: Payer: Self-pay | Admitting: General Surgery

## 2016-06-15 VITALS — BP 107/72 | HR 84 | Temp 97.2°F | Ht 71.0 in | Wt 201.0 lb

## 2016-06-15 DIAGNOSIS — I10 Essential (primary) hypertension: Secondary | ICD-10-CM | POA: Insufficient documentation

## 2016-06-15 DIAGNOSIS — K8050 Calculus of bile duct without cholangitis or cholecystitis without obstruction: Secondary | ICD-10-CM | POA: Diagnosis not present

## 2016-06-15 NOTE — Patient Instructions (Signed)
We have scheduled your Laparoscopic Cholecystectomy for 06/30/2016 with Dr. Adonis Huguenin.  See blue attached surgery form for further details.  If this date does not work for you please give our office a call so that we can reschedule this.

## 2016-06-16 DIAGNOSIS — K8050 Calculus of bile duct without cholangitis or cholecystitis without obstruction: Secondary | ICD-10-CM | POA: Insufficient documentation

## 2016-06-16 NOTE — Progress Notes (Signed)
Patient ID: Victor Cuevas, male   DOB: 07-24-1980, 36 y.o.   MRN: [id number]  CC: Right-sided abdominal pain  HPI Victor Cuevas is a 36 y.o. male presents to clinic for evaluation of right-sided abdominal pain. Patient states is been going on for many months and progressively getting worse. He notices it especially worse if he eats a fatty meal or a large meal. He does occasionally have some nausea and diarrhea denies any fevers, chills, chest pain, shortness of breath, constipation. He is otherwise in his usual state of excellent health. He reports no recent travel, no sick contacts, no changes in medication. The pain is usually a dull ache with the occasional problem. It is always to his right side and radiating up to its upper midline.  HPI  Past Medical History:  Diagnosis Date  . Chicken pox   . Hypertension     Past Surgical History:  Procedure Laterality Date  . NO PAST SURGERIES     verified with patient 06/15/16    Family History  Problem Relation Age of Onset  . Hypertension Father   . Diabetes Father   . Hypertension Maternal Grandfather   . Diabetes Maternal Grandfather   . Lymphoma Mother   . Prostate cancer Neg Hx   . Kidney cancer Neg Hx   . Bladder Cancer Neg Hx     Social History Social History  Substance Use Topics  . Smoking status: Former Smoker    Packs/day: 1.00    Types: Cigarettes    Quit date: 02/20/2005  . Smokeless tobacco: Never Used  . Alcohol use No    No Known Allergies  Current Outpatient Prescriptions  Medication Sig Dispense Refill  . loratadine-pseudoephedrine (CLARITIN-D 24-HOUR) 10-240 MG 24 hr tablet Take 1 tablet by mouth daily.     No current facility-administered medications for this visit.      Review of Systems A Multi-point review of systems was asked and was negative except for the findings documented in the history of present illness  Physical Exam Blood pressure 107/72, pulse 84, temperature 97.2 F (36.2 C),  temperature source Oral, height 5\' 11"  (1.803 m), weight 91.2 kg (201 lb). CONSTITUTIONAL: No acute distress. EYES: Pupils are equal, round, and reactive to light, Sclera are non-icteric. EARS, NOSE, MOUTH AND THROAT: The oropharynx is clear. The oral mucosa is pink and moist. Hearing is intact to voice. LYMPH NODES:  Lymph nodes in the neck are normal. RESPIRATORY:  Lungs are clear. There is normal respiratory effort, with equal breath sounds bilaterally, and without pathologic use of accessory muscles. CARDIOVASCULAR: Heart is regular without murmurs, gallops, or rubs. GI: The abdomen is soft, nontender, and nondistended. There are no palpable masses. There is no hepatosplenomegaly. There are normal bowel sounds in all quadrants. GU: Rectal deferred.   MUSCULOSKELETAL: Normal muscle strength and tone. No cyanosis or edema.   SKIN: Turgor is good and there are no pathologic skin lesions or ulcers. NEUROLOGIC: Motor and sensation is grossly normal. Cranial nerves are grossly intact. PSYCH:  Oriented to person, place and time. Affect is normal.  Data Reviewed Images reviewed including ultrasound which shows evidence of cholelithiasis without cholecystitis. I have personally reviewed the patient's imaging, laboratory findings and medical records.    Assessment    Symptomatic cholelithiasis    Plan    36 year old male with symptomatic cholelithiasis. I discussed the procedure of laparoscopic cholecystectomy in detail.  The patient was given Neurosurgeon.  We discussed the risks and benefits of  a laparoscopic cholecystectomy and possible cholangiogram including, but not limited to bleeding, infection, injury to surrounding structures such as the intestine or liver, bile leak, retained gallstones, need to convert to an open procedure, prolonged diarrhea, blood clots such as  DVT, common bile duct injury, anesthesia risks, and possible need for additional procedures.  The likelihood of  improvement in symptoms and return to the patient's normal status is good. We discussed the typical post-operative recovery course. Patient voiced understanding and desires to proceed. We will tentatively plan for surgery on Friday, May 11.      Time spent with the patient was 45 minutes, with more than 50% of the time spent in face-to-face education, counseling and care coordination.     Clayburn Pert, MD FACS General Surgeon 06/16/2016, 8:14 PM

## 2016-06-20 ENCOUNTER — Telehealth: Payer: Self-pay

## 2016-06-20 NOTE — Telephone Encounter (Signed)
Call made to patient to advise of Surgery Date as well as Pre-Admission appointment date, time, and location. No answer. Left voicemail for return phone call.  If patient calls back, surgery information is below:  Surgery Date: 06/30/16  Pre-admit Appointment: 06/23/16 from [place] (Phone)  Patient has been advised to call [phone] the day before surgery between 1-3pm to obtain arrival time.

## 2016-06-21 NOTE — Telephone Encounter (Signed)
No authorization required for CPT code [phone] per Virgie Dad. @ [place].

## 2016-06-21 NOTE — Telephone Encounter (Signed)
Left message for patient to return call.  I need the number for providers to call for prior authorizations.  The back of his insurance card was not scanned.

## 2016-06-22 ENCOUNTER — Telehealth: Payer: Self-pay

## 2016-06-22 NOTE — Telephone Encounter (Signed)
Call made to patient to advise of Surgery Date as well as Pre-Admission appointment date, time, and location. No answer. Left voicemail for return phone call.  If patient calls back, surgery information is below:  Surgery Date: 06/30/16  Pre-admit Appointment: 06/23/16 from [place] (Phone)  Patient has been advised to call [phone] the day before surgery between 1-3pm to obtain arrival time.

## 2016-06-23 ENCOUNTER — Inpatient Hospital Stay: Admission: RE | Admit: 2016-06-23 | Payer: BLUE CROSS/BLUE SHIELD | Source: Ambulatory Visit

## 2016-06-23 NOTE — Telephone Encounter (Signed)
Heather from pre-admit called. This patient wants to reschedule his surgery for the beginning of June due to work. He needs it done before June 30th. Another phone interview will need to be set up, phone interview for today was not completed.

## 2016-06-26 ENCOUNTER — Telehealth: Payer: Self-pay | Admitting: General Practice

## 2016-06-26 NOTE — Telephone Encounter (Signed)
Patient called and is wanting to reschedule his surgery, his surgery date is on 06/30/16, but the patient is asking if he can move it to 08/05/16-08/19/16 someday between those dates. His insurance will be changing on 08/20/16. Please call patient with surgery information.

## 2016-06-27 NOTE — Telephone Encounter (Signed)
Patient has requested to move his surgery to 08/09/16. I explained to patient that he would need to be seen to update H&P within 30 days. Appointment made to be seen in office by Dr. Adonis Huguenin on 07/20/16.  Dr. Adonis Huguenin and OR made aware.

## 2016-07-20 ENCOUNTER — Ambulatory Visit (INDEPENDENT_AMBULATORY_CARE_PROVIDER_SITE_OTHER): Payer: BLUE CROSS/BLUE SHIELD | Admitting: General Surgery

## 2016-07-20 ENCOUNTER — Encounter: Payer: Self-pay | Admitting: General Surgery

## 2016-07-20 VITALS — BP 110/72 | HR 75 | Temp 97.8°F | Ht 71.0 in | Wt 192.0 lb

## 2016-07-20 DIAGNOSIS — K8050 Calculus of bile duct without cholangitis or cholecystitis without obstruction: Secondary | ICD-10-CM

## 2016-07-20 NOTE — Patient Instructions (Addendum)
Please look at your blue sheet in case you have any questions or concerns about your surgery. 

## 2016-07-20 NOTE — Progress Notes (Signed)
Outpatient Surgical Follow Up  07/20/2016  Victor Cuevas is an 36 y.o. male.   Chief Complaint  Patient presents with  . Follow-up    Update H&P for upcoming Laparoscopic Cholecystectomy (Scheduled 08/09/16)    HPI: 36 year old male returns to clinic for evaluation of biliary colic. He was previously scheduled for laparoscopic cholecystectomy earlier this month but had to reschedule due to work obligations. He states he continues have occasional right-sided abdominal pain. He has learned which foods to avoid to prevent it from being severe. But he still has the pains if he eats a large or fatty meal. He denies any nausea or diarrhea since his last visit. He denies any fevers, chills, chest pain, shortness of breath, constipation. He is ready to have his collar removed so that he can quit worrying about what he eats.  Past Medical History:  Diagnosis Date  . Chicken pox   . Hypertension     Past Surgical History:  Procedure Laterality Date  . NO PAST SURGERIES     verified with patient 06/15/16    Family History  Problem Relation Age of Onset  . Hypertension Father   . Diabetes Father   . Hypertension Maternal Grandfather   . Diabetes Maternal Grandfather   . Lymphoma Mother   . Prostate cancer Neg Hx   . Kidney cancer Neg Hx   . Bladder Cancer Neg Hx     Social History:  reports that he quit smoking about 11 years ago. His smoking use included Cigarettes. He smoked 1.00 pack per day. He has never used smokeless tobacco. He reports that he does not drink alcohol or use drugs.  Allergies: No Known Allergies  Medications reviewed.    ROS A multipoint review of systems was completed. All pertinent positives and negatives are documented within the history of present illness and remainder are negative.   BP 110/72   Pulse 75   Temp 97.8 F (36.6 C) (Oral)   Ht 5\' 11"  (1.803 m)   Wt 87.1 kg (192 lb)   BMI 26.78 kg/m   Physical Exam Gen.: No acute distress Neck:  Supple and nontender Lymph nodes: No evidence of cervical or clavicular lymphadenopathy Chest: Clear to auscultation Heart: Regular rate and rhythm Abdomen: Soft, nontender, nondistended. Skin: Intact without lesion or ulcer Extremities: Moves all extremities well   No results found for this or any previous visit (from the past 48 hour(s)). No results found.  Assessment/Plan:  1. Biliary colic 36 year old male with symptomatic cholelithiasis. I again discussed the procedure of a laparoscopic cholecystectomy in detail. We discussed the risks and benefits of a laparoscopic cholecystectomy and possible cholangiogram including, but not limited to bleeding, infection, injury to surrounding structures such as the intestine or liver, bile leak, retained gallstones, need to convert to an open procedure, prolonged diarrhea, blood clots such as  DVT, common bile duct injury, anesthesia risks, and possible need for additional procedures.  The likelihood of improvement in symptoms and return to the patient's normal status is good. We discussed the typical post-operative recovery course. Patient voiced understanding and states he is afraid to proceed. Plan for surgery on June 20.    Clayburn Pert, MD FACS General Surgeon  07/20/2016,4:01 PM

## 2016-07-21 ENCOUNTER — Ambulatory Visit: Payer: BLUE CROSS/BLUE SHIELD | Admitting: Family Medicine

## 2016-08-09 ENCOUNTER — Encounter: Payer: Self-pay | Admitting: *Deleted

## 2016-08-09 ENCOUNTER — Encounter
Admission: RE | Disposition: A | Discharge status: Home / Self Care | Payer: Self-pay | Source: Ambulatory Visit | Attending: General Surgery

## 2016-08-09 ENCOUNTER — Ambulatory Visit: Payer: BLUE CROSS/BLUE SHIELD | Admitting: Anesthesiology

## 2016-08-09 ENCOUNTER — Ambulatory Visit
Admission: RE | Admit: 2016-08-09 | Discharge: 2016-08-09 | Disposition: A | Discharge status: Home / Self Care | Payer: BLUE CROSS/BLUE SHIELD | Source: Ambulatory Visit | Attending: General Surgery | Admitting: General Surgery

## 2016-08-09 DIAGNOSIS — Z833 Family history of diabetes mellitus: Secondary | ICD-10-CM | POA: Diagnosis not present

## 2016-08-09 DIAGNOSIS — Z8619 Personal history of other infectious and parasitic diseases: Secondary | ICD-10-CM | POA: Diagnosis not present

## 2016-08-09 DIAGNOSIS — K8070 Calculus of gallbladder and bile duct without cholecystitis without obstruction: Secondary | ICD-10-CM | POA: Insufficient documentation

## 2016-08-09 DIAGNOSIS — Z8249 Family history of ischemic heart disease and other diseases of the circulatory system: Secondary | ICD-10-CM | POA: Insufficient documentation

## 2016-08-09 DIAGNOSIS — Z807 Family history of other malignant neoplasms of lymphoid, hematopoietic and related tissues: Secondary | ICD-10-CM | POA: Diagnosis not present

## 2016-08-09 DIAGNOSIS — I10 Essential (primary) hypertension: Secondary | ICD-10-CM | POA: Diagnosis not present

## 2016-08-09 DIAGNOSIS — Z87891 Personal history of nicotine dependence: Secondary | ICD-10-CM | POA: Diagnosis not present

## 2016-08-09 DIAGNOSIS — K8020 Calculus of gallbladder without cholecystitis without obstruction: Secondary | ICD-10-CM

## 2016-08-09 HISTORY — PX: CHOLECYSTECTOMY: SHX55

## 2016-08-09 LAB — GLUCOSE, CAPILLARY: GLUCOSE-CAPILLARY: 106 mg/dL — AB (ref 65–99)

## 2016-08-09 SURGERY — LAPAROSCOPIC CHOLECYSTECTOMY
Anesthesia: General | Site: Abdomen | Wound class: Clean Contaminated

## 2016-08-09 MED ORDER — FAMOTIDINE 20 MG PO TABS
ORAL_TABLET | ORAL | Status: AC
  Filled 2016-08-09: qty 1

## 2016-08-09 MED ORDER — ONDANSETRON HCL 4 MG/2ML IJ SOLN
INTRAMUSCULAR | Status: AC
  Filled 2016-08-09: qty 2

## 2016-08-09 MED ORDER — PHENYLEPHRINE HCL 10 MG/ML IJ SOLN
INTRAMUSCULAR | Status: DC | PRN
  Administered 2016-08-09: 50 ug via INTRAVENOUS

## 2016-08-09 MED ORDER — KETOROLAC TROMETHAMINE 30 MG/ML IJ SOLN
INTRAMUSCULAR | Status: DC | PRN
  Administered 2016-08-09: 30 mg via INTRAVENOUS

## 2016-08-09 MED ORDER — MIDAZOLAM HCL 2 MG/2ML IJ SOLN
INTRAMUSCULAR | Status: DC | PRN
  Administered 2016-08-09: 2 mg via INTRAVENOUS

## 2016-08-09 MED ORDER — CHLORHEXIDINE GLUCONATE CLOTH 2 % EX PADS: 6.0000 | MEDICATED_PAD | Freq: Once | CUTANEOUS | Status: DC

## 2016-08-09 MED ORDER — FENTANYL CITRATE (PF) 100 MCG/2ML IJ SOLN
25.0000 ug | INTRAMUSCULAR | Status: DC | PRN
  Administered 2016-08-09: 25 ug via INTRAVENOUS

## 2016-08-09 MED ORDER — BUPIVACAINE-EPINEPHRINE (PF) 0.25% -1:200000 IJ SOLN
INTRAMUSCULAR | Status: AC
  Filled 2016-08-09: qty 30

## 2016-08-09 MED ORDER — FAMOTIDINE 20 MG PO TABS
20.0000 mg | ORAL_TABLET | Freq: Once | ORAL | Status: AC
  Administered 2016-08-09: 20 mg via ORAL

## 2016-08-09 MED ORDER — DEXAMETHASONE SODIUM PHOSPHATE 10 MG/ML IJ SOLN
INTRAMUSCULAR | Status: AC
  Filled 2016-08-09: qty 1

## 2016-08-09 MED ORDER — DEXAMETHASONE SODIUM PHOSPHATE 10 MG/ML IJ SOLN
INTRAMUSCULAR | Status: DC | PRN
  Administered 2016-08-09: 10 mg via INTRAVENOUS

## 2016-08-09 MED ORDER — SEVOFLURANE IN SOLN
RESPIRATORY_TRACT | Status: AC
  Filled 2016-08-09: qty 250

## 2016-08-09 MED ORDER — HYDROCODONE-ACETAMINOPHEN 5-325 MG PO TABS: 1.0000 | ORAL_TABLET | Freq: Four times a day (QID) | ORAL | 0 refills | Status: DC | PRN

## 2016-08-09 MED ORDER — FENTANYL CITRATE (PF) 250 MCG/5ML IJ SOLN
INTRAMUSCULAR | Status: AC
  Filled 2016-08-09: qty 5

## 2016-08-09 MED ORDER — ONDANSETRON HCL 4 MG/2ML IJ SOLN
INTRAMUSCULAR | Status: DC | PRN
  Administered 2016-08-09: 4 mg via INTRAVENOUS

## 2016-08-09 MED ORDER — DEXTROSE 5 % IV SOLN
1.0000 g | INTRAVENOUS | Status: AC
  Administered 2016-08-09: 1 g via INTRAVENOUS
  Filled 2016-08-09: qty 1

## 2016-08-09 MED ORDER — KETOROLAC TROMETHAMINE 30 MG/ML IJ SOLN
INTRAMUSCULAR | Status: AC
  Filled 2016-08-09: qty 1

## 2016-08-09 MED ORDER — OXYMETAZOLINE HCL 0.05 % NA SOLN
NASAL | Status: AC
  Filled 2016-08-09: qty 15

## 2016-08-09 MED ORDER — LACTATED RINGERS IV SOLN
INTRAVENOUS | Status: DC
  Administered 2016-08-09 (×3): via INTRAVENOUS

## 2016-08-09 MED ORDER — SUGAMMADEX SODIUM 200 MG/2ML IV SOLN
INTRAVENOUS | Status: AC
  Filled 2016-08-09: qty 2

## 2016-08-09 MED ORDER — ROCURONIUM BROMIDE 100 MG/10ML IV SOLN
INTRAVENOUS | Status: DC | PRN
  Administered 2016-08-09: 40 mg via INTRAVENOUS

## 2016-08-09 MED ORDER — ACETAMINOPHEN 10 MG/ML IV SOLN
INTRAVENOUS | Status: DC | PRN
  Administered 2016-08-09: 1000 mg via INTRAVENOUS

## 2016-08-09 MED ORDER — PROPOFOL 10 MG/ML IV BOLUS
INTRAVENOUS | Status: AC
  Filled 2016-08-09: qty 20

## 2016-08-09 MED ORDER — FENTANYL CITRATE (PF) 100 MCG/2ML IJ SOLN
INTRAMUSCULAR | Status: DC | PRN
  Administered 2016-08-09: 50 ug via INTRAVENOUS

## 2016-08-09 MED ORDER — BUPIVACAINE HCL 0.5 % IJ SOLN
INTRAMUSCULAR | Status: DC | PRN
  Administered 2016-08-09: 12 mL

## 2016-08-09 MED ORDER — ONDANSETRON HCL 4 MG/2ML IJ SOLN: 4.0000 mg | Freq: Once | INTRAMUSCULAR | Status: DC | PRN

## 2016-08-09 MED ORDER — FENTANYL CITRATE (PF) 100 MCG/2ML IJ SOLN
INTRAMUSCULAR | Status: AC
  Administered 2016-08-09: 25 ug via INTRAVENOUS
  Filled 2016-08-09: qty 2

## 2016-08-09 MED ORDER — LIDOCAINE HCL (CARDIAC) 20 MG/ML IV SOLN
INTRAVENOUS | Status: DC | PRN
Start: 2016-08-09 — End: 2016-08-09
  Administered 2016-08-09: 40 mg via INTRAVENOUS

## 2016-08-09 MED ORDER — PROPOFOL 10 MG/ML IV BOLUS
INTRAVENOUS | Status: DC | PRN
  Administered 2016-08-09: 160 mg via INTRAVENOUS

## 2016-08-09 MED ORDER — SUGAMMADEX SODIUM 200 MG/2ML IV SOLN
INTRAVENOUS | Status: DC | PRN
  Administered 2016-08-09: 180 mg via INTRAVENOUS

## 2016-08-09 MED ORDER — LIDOCAINE HCL 1 % IJ SOLN
INTRAMUSCULAR | Status: DC | PRN
  Administered 2016-08-09: 12 mL

## 2016-08-09 MED ORDER — MIDAZOLAM HCL 2 MG/2ML IJ SOLN
INTRAMUSCULAR | Status: AC
  Filled 2016-08-09: qty 2

## 2016-08-09 SURGICAL SUPPLY — 46 items
ADHESIVE MASTISOL STRL (MISCELLANEOUS) ×3 IMPLANT
APPLIER CLIP ROT 10 11.4 M/L (STAPLE) ×3
BAG COUNTER SPONGE EZ (MISCELLANEOUS) ×2 IMPLANT
BLADE CLIPPER SURG (BLADE) ×3 IMPLANT
BLADE SURG SZ11 CARB STEEL (BLADE) ×3 IMPLANT
CANISTER SUCT 1200ML W/VALVE (MISCELLANEOUS) ×3 IMPLANT
CATH CHOLANG 76X19 KUMAR (CATHETERS) IMPLANT
CHLORAPREP W/TINT 26ML (MISCELLANEOUS) ×3 IMPLANT
CLIP APPLIE ROT 10 11.4 M/L (STAPLE) ×1 IMPLANT
CLOSURE WOUND 1/2 X4 (GAUZE/BANDAGES/DRESSINGS)
CONRAY 60ML FOR OR (MISCELLANEOUS) IMPLANT
COUNTER SPONGE BAG EZ (MISCELLANEOUS) ×1
DECANTER SPIKE VIAL GLASS SM (MISCELLANEOUS) IMPLANT
DRAPE SHEET LG 3/4 BI-LAMINATE (DRAPES) ×3 IMPLANT
DRSG TEGADERM 2-3/8X2-3/4 SM (GAUZE/BANDAGES/DRESSINGS) ×12 IMPLANT
DRSG TELFA 4X3 1S NADH ST (GAUZE/BANDAGES/DRESSINGS) ×3 IMPLANT
ELECT REM PT RETURN 9FT ADLT (ELECTROSURGICAL) ×3
ELECTRODE REM PT RTRN 9FT ADLT (ELECTROSURGICAL) ×1 IMPLANT
GLOVE BIO SURGEON STRL SZ7.5 (GLOVE) ×3 IMPLANT
GLOVE INDICATOR 8.0 STRL GRN (GLOVE) ×3 IMPLANT
GOWN STRL REUS W/ TWL LRG LVL3 (GOWN DISPOSABLE) ×3 IMPLANT
GOWN STRL REUS W/TWL LRG LVL3 (GOWN DISPOSABLE) ×6
GRASPER SUT TROCAR 14GX15 (MISCELLANEOUS) IMPLANT
IRRIGATION STRYKERFLOW (MISCELLANEOUS) ×1 IMPLANT
IRRIGATOR STRYKERFLOW (MISCELLANEOUS) ×3
IV NS 1000ML (IV SOLUTION) ×2
IV NS 1000ML BAXH (IV SOLUTION) ×1 IMPLANT
L-HOOK LAP DISP 36CM (ELECTROSURGICAL) ×3
LABEL OR SOLS (LABEL) ×3 IMPLANT
LHOOK LAP DISP 36CM (ELECTROSURGICAL) ×1 IMPLANT
NEEDLE HYPO 25X1 1.5 SAFETY (NEEDLE) ×3 IMPLANT
NEEDLE VERESS 14GA 120MM (NEEDLE) ×3 IMPLANT
NS IRRIG 500ML POUR BTL (IV SOLUTION) ×3 IMPLANT
PACK LAP CHOLECYSTECTOMY (MISCELLANEOUS) ×3 IMPLANT
PENCIL ELECTRO HAND CTR (MISCELLANEOUS) ×3 IMPLANT
POUCH ENDO CATCH 10MM SPEC (MISCELLANEOUS) ×3 IMPLANT
SCISSORS METZENBAUM CVD 33 (INSTRUMENTS) IMPLANT
SLEEVE ENDOPATH XCEL 5M (ENDOMECHANICALS) ×6 IMPLANT
STRIP CLOSURE SKIN 1/2X4 (GAUZE/BANDAGES/DRESSINGS) IMPLANT
SUT MNCRL 4-0 (SUTURE) ×2
SUT MNCRL 4-0 27XMFL (SUTURE) ×1
SUT VICRYL 0 AB UR-6 (SUTURE) IMPLANT
SUTURE MNCRL 4-0 27XMF (SUTURE) ×1 IMPLANT
TROCAR XCEL 12X100 BLDLESS (ENDOMECHANICALS) ×3 IMPLANT
TROCAR XCEL NON-BLD 5MMX100MML (ENDOMECHANICALS) ×3 IMPLANT
TUBING INSUFFLATOR HI FLOW (MISCELLANEOUS) ×3 IMPLANT

## 2016-08-09 NOTE — Anesthesia Post-op Follow-up Note (Cosign Needed)
Anesthesia QCDR form completed.        

## 2016-08-09 NOTE — Progress Notes (Signed)
Patient ready to be discharged, assisted up to the wheelchair without dizziness, lightheadedness, or nausea. Making conversation.

## 2016-08-09 NOTE — Discharge Instructions (Signed)
Laparoscopic Cholecystectomy, Care After This sheet gives you information about how to care for yourself after your procedure. Your health care provider may also give you more specific instructions. If you have problems or questions, contact your health care provider. What can I expect after the procedure? After the procedure, it is common to have:  Pain at your incision sites. You will be given medicines to control this pain.  Mild nausea or vomiting.  Bloating and possible shoulder pain from the air-like gas that was used during the procedure.  Follow these instructions at home: Incision care   Follow instructions from your health care provider about how to take care of your incisions. Make sure you: ? Wash your hands with soap and water before you change your bandage (dressing). If soap and water are not available, use hand sanitizer. ? Change your dressing as told by your health care provider. ? Leave stitches (sutures), skin glue, or adhesive strips in place. These skin closures may need to be in place for 2 weeks or longer. If adhesive strip edges start to loosen and curl up, you may trim the loose edges. Do not remove adhesive strips completely unless your health care provider tells you to do that.  Do not take baths, swim, or use a hot tub until your health care provider approves. Ask your health care provider if you can take showers. You may only be allowed to take sponge baths for bathing. Okay to shower in 24 hours. Do not submerge until told otherwise.  Check your incision area every day for signs of infection. Check for: ? More redness, swelling, or pain. ? More fluid or blood. ? Warmth. ? Pus or a bad smell. Activity  Do not drive or use heavy machinery while taking prescription pain medicine.  Do not lift anything that is heavier than 10 lb (4.5 kg) until your health care provider approves.  Do not play contact sports until your health care provider approves.  Do not  drive for 24 hours if you were given a medicine to help you relax (sedative).  Rest as needed. Do not return to work or school until your health care provider approves. General instructions  Take over-the-counter and prescription medicines only as told by your health care provider.  To prevent or treat constipation while you are taking prescription pain medicine, your health care provider may recommend that you: ? Drink enough fluid to keep your urine clear or pale yellow. ? Take over-the-counter or prescription medicines. ? Eat foods that are high in fiber, such as fresh fruits and vegetables, whole grains, and beans. ? Limit foods that are high in fat and processed sugars, such as fried and sweet foods. Contact a health care provider if:  You develop a rash.  You have more redness, swelling, or pain around your incisions.  You have more fluid or blood coming from your incisions.  Your incisions feel warm to the touch.  You have pus or a bad smell coming from your incisions.  You have a fever.  One or more of your incisions breaks open. Get help right away if:  You have trouble breathing.  You have chest pain.  You have increasing pain in your shoulders.  You faint or feel dizzy when you stand.  You have severe pain in your abdomen.  You have nausea or vomiting that lasts for more than one day.  You have leg pain. This information is not intended to replace advice given to you  by your health care provider. Make sure you discuss any questions you have with your health care provider. Document Released: 02/06/2005 Document Revised: 08/28/2015 Document Reviewed: 07/26/2015 Elsevier Interactive Patient Education  2017 [employer] Anesthesia, Adult, Care After These instructions provide you with information about caring for yourself after your procedure. Your health care provider may also give you more specific instructions. Your treatment has been planned  according to current medical practices, but problems sometimes occur. Call your health care provider if you have any problems or questions after your procedure. What can I expect after the procedure? After the procedure, it is common to have:  Vomiting.  A sore throat.  Mental slowness.  It is common to feel:  Nauseous.  Cold or shivery.  Sleepy.  Tired.  Sore or achy, even in parts of your body where you did not have surgery.  Follow these instructions at home: For at least 24 hours after the procedure:  Do not: ? Participate in activities where you could fall or become injured. ? Drive. ? Use heavy machinery. ? Drink alcohol. ? Take sleeping pills or medicines that cause drowsiness. ? Make important decisions or sign legal documents. ? Take care of children on your own.  Rest. Eating and drinking  If you vomit, drink water, juice, or soup when you can drink without vomiting.  Drink enough fluid to keep your urine clear or pale yellow.  Make sure you have little or no nausea before eating solid foods.  Follow the diet recommended by your health care provider. General instructions  Have a responsible adult stay with you until you are awake and alert.  Return to your normal activities as told by your health care provider. Ask your health care provider what activities are safe for you.  Take over-the-counter and prescription medicines only as told by your health care provider.  If you smoke, do not smoke without supervision.  Keep all follow-up visits as told by your health care provider. This is important. Contact a health care provider if:  You continue to have nausea or vomiting at home, and medicines are not helpful.  You cannot drink fluids or start eating again.  You cannot urinate after 8-12 hours.  You develop a skin rash.  You have fever.  You have increasing redness at the site of your procedure. Get help right away if:  You have  difficulty breathing.  You have chest pain.  You have unexpected bleeding.  You feel that you are having a life-threatening or urgent problem. This information is not intended to replace advice given to you by your health care provider. Make sure you discuss any questions you have with your health care provider. Document Released: 05/15/2000 Document Revised: 07/12/2015 Document Reviewed: 01/21/2015 Elsevier Interactive Patient Education  [employer].

## 2016-08-09 NOTE — Brief Op Note (Signed)
08/09/2016  3:35 PM  PATIENT:  Victor Cuevas  36 y.o. male  PRE-OPERATIVE DIAGNOSIS:  CHOLELITHIASIS  POST-OPERATIVE DIAGNOSIS:  CHOLELITHIASIS  PROCEDURE:  Procedure(s): LAPAROSCOPIC CHOLECYSTECTOMY (N/A)  SURGEON:  Surgeon(s) and Role:    * Clayburn Pert, MD - Primary  PHYSICIAN ASSISTANT:   ASSISTANTS: none   ANESTHESIA:   general  EBL:  Total I/O In: 300 [I.V.:300] Out: -   BLOOD ADMINISTERED:none  DRAINS: none   LOCAL MEDICATIONS USED:  MARCAINE   , XYLOCAINE  and Amount: 24 ml  SPECIMEN:  Source of Specimen:  gallbladder  DISPOSITION OF SPECIMEN:  PATHOLOGY  COUNTS:  YES  TOURNIQUET:  * No tourniquets in log *  DICTATION: .Dragon Dictation  PLAN OF CARE: Discharge to home after PACU  PATIENT DISPOSITION:  PACU - hemodynamically stable.   Delay start of Pharmacological VTE agent (>24hrs) due to surgical blood loss or risk of bleeding: no

## 2016-08-09 NOTE — Anesthesia Preprocedure Evaluation (Signed)
Anesthesia Evaluation  Patient identified by MRN, date of birth, ID band  Reviewed: Allergy & Precautions, NPO status , Patient's Chart, lab work & pertinent test results  Airway Mallampati: II       Dental no notable dental hx.    Pulmonary former smoker,    Pulmonary exam normal        Cardiovascular hypertension, Normal cardiovascular exam     Neuro/Psych negative neurological ROS  negative psych ROS   GI/Hepatic Neg liver ROS,   Endo/Other  negative endocrine ROS  Renal/GU negative Renal ROS  negative genitourinary   Musculoskeletal negative musculoskeletal ROS (+)   Abdominal Normal abdominal exam  (+)   Peds negative pediatric ROS (+)  Hematology negative hematology ROS (+)   Anesthesia Other Findings Past Medical History: No date: Chicken pox No date: Hypertension  Reproductive/Obstetrics                             Anesthesia Physical Anesthesia Plan  ASA: II  Anesthesia Plan: General   Post-op Pain Management:    Induction: Intravenous  PONV Risk Score and Plan: 2 and Ondansetron and Dexamethasone  Airway Management Planned: Oral ETT  Additional Equipment:   Intra-op Plan:   Post-operative Plan: Extubation in OR  Informed Consent: I have reviewed the patients History and Physical, chart, labs and discussed the procedure including the risks, benefits and alternatives for the proposed anesthesia with the patient or authorized representative who has indicated his/her understanding and acceptance.   Dental advisory given  Plan Discussed with: CRNA and Surgeon  Anesthesia Plan Comments:         Anesthesia Quick Evaluation

## 2016-08-09 NOTE — Anesthesia Procedure Notes (Signed)
Procedure Name: Intubation Date/Time: 08/09/2016 2:45 PM Performed by: Allean Found Pre-anesthesia Checklist: Patient identified, Emergency Drugs available, Patient being monitored, Timeout performed and Suction available Patient Re-evaluated:Patient Re-evaluated prior to inductionOxygen Delivery Method: Circle system utilized Preoxygenation: Pre-oxygenation with 100% oxygen Intubation Type: IV induction Ventilation: Mask ventilation without difficulty Laryngoscope Size: Mac and 4 Grade View: Grade I Tube type: Oral Tube size: 7.5 mm Number of attempts: 1 Airway Equipment and Method: Stylet Placement Confirmation: ETT inserted through vocal cords under direct vision,  positive ETCO2 and breath sounds checked- equal and bilateral Secured at: 22 cm Tube secured with: Tape Dental Injury: Teeth and Oropharynx as per pre-operative assessment  Future Recommendations: Recommend- induction with short-acting agent, and alternative techniques readily available

## 2016-08-09 NOTE — H&P (View-Only) (Signed)
Outpatient Surgical Follow Up  07/20/2016  Clarence Cogswell is an 36 y.o. male.   Chief Complaint  Patient presents with  . Follow-up    Update H&P for upcoming Laparoscopic Cholecystectomy (Scheduled 08/09/16)    HPI: 36 year old male returns to clinic for evaluation of biliary colic. He was previously scheduled for laparoscopic cholecystectomy earlier this month but had to reschedule due to work obligations. He states he continues have occasional right-sided abdominal pain. He has learned which foods to avoid to prevent it from being severe. But he still has the pains if he eats a large or fatty meal. He denies any nausea or diarrhea since his last visit. He denies any fevers, chills, chest pain, shortness of breath, constipation. He is ready to have his collar removed so that he can quit worrying about what he eats.  Past Medical History:  Diagnosis Date  . Chicken pox   . Hypertension     Past Surgical History:  Procedure Laterality Date  . NO PAST SURGERIES     verified with patient 06/15/16    Family History  Problem Relation Age of Onset  . Hypertension Father   . Diabetes Father   . Hypertension Maternal Grandfather   . Diabetes Maternal Grandfather   . Lymphoma Mother   . Prostate cancer Neg Hx   . Kidney cancer Neg Hx   . Bladder Cancer Neg Hx     Social History:  reports that he quit smoking about 11 years ago. His smoking use included Cigarettes. He smoked 1.00 pack per day. He has never used smokeless tobacco. He reports that he does not drink alcohol or use drugs.  Allergies: No Known Allergies  Medications reviewed.    ROS A multipoint review of systems was completed. All pertinent positives and negatives are documented within the history of present illness and remainder are negative.   BP 110/72   Pulse 75   Temp 97.8 F (36.6 C) (Oral)   Ht 5\' 11"  (1.803 m)   Wt 87.1 kg (192 lb)   BMI 26.78 kg/m   Physical Exam Gen.: No acute distress Neck:  Supple and nontender Lymph nodes: No evidence of cervical or clavicular lymphadenopathy Chest: Clear to auscultation Heart: Regular rate and rhythm Abdomen: Soft, nontender, nondistended. Skin: Intact without lesion or ulcer Extremities: Moves all extremities well   No results found for this or any previous visit (from the past 48 hour(s)). No results found.  Assessment/Plan:  1. Biliary colic 36 year old male with symptomatic cholelithiasis. I again discussed the procedure of a laparoscopic cholecystectomy in detail. We discussed the risks and benefits of a laparoscopic cholecystectomy and possible cholangiogram including, but not limited to bleeding, infection, injury to surrounding structures such as the intestine or liver, bile leak, retained gallstones, need to convert to an open procedure, prolonged diarrhea, blood clots such as  DVT, common bile duct injury, anesthesia risks, and possible need for additional procedures.  The likelihood of improvement in symptoms and return to the patient's normal status is good. We discussed the typical post-operative recovery course. Patient voiced understanding and states he is afraid to proceed. Plan for surgery on June 20.    Clayburn Pert, MD FACS General Surgeon  07/20/2016,4:01 PM

## 2016-08-09 NOTE — Progress Notes (Signed)
After getting dressed, became light headed, pale and diaphoretic. Assisted to recliner in a reclined position, capillary blood sugar 106. B/P 101/49, HR 50.  A new bag of lactated ringers hung. Warm blanket provided with cold wash cloth on patient's forehead. Wife at bedside. Will continue to monitor.

## 2016-08-09 NOTE — Op Note (Signed)
Laparoscopic Cholecystectomy  Pre-operative Diagnosis: Symptomatic cholelithiasis  Post-operative Diagnosis: Same  Procedure: Laparoscopic cholecystectomy  Surgeon: Juanda Crumble T. Adonis Huguenin, MD FACS  Anesthesia: Gen. with endotracheal tube  Assistant: None  Procedure Details  The patient was seen again in the Holding Room. The benefits, complications, treatment options, and expected outcomes were discussed with the patient. The risks of bleeding, infection, recurrence of symptoms, failure to resolve symptoms, bile duct damage, bile duct leak, retained common bile duct stone, bowel injury, any of which could require further surgery and/or ERCP, stent, or papillotomy were reviewed with the patient. The likelihood of improving the patient's symptoms with return to their baseline status is good.  The patient and/or family concurred with the proposed plan, giving informed consent.  The patient was taken to Operating Room, identified as Victor Cuevas and the procedure verified as Laparoscopic Cholecystectomy.  A Time Out was held and the above information confirmed.  Prior to the induction of general anesthesia, antibiotic prophylaxis was administered. VTE prophylaxis was in place. General endotracheal anesthesia was then administered and tolerated well. After the induction, the abdomen was prepped with Chloraprep and draped in the sterile fashion. The patient was positioned in the supine position.  Local anesthetic  was injected into the skin near the umbilicus and an incision made. The Veress needle was placed. Pneumoperitoneum was then created with CO2 and tolerated well without any adverse changes in the patient's vital signs. A 76mm port was placed in the periumbilical position and the abdominal cavity was explored.  Two 5-mm ports were placed in the right upper quadrant and a 12 mm epigastric port was placed all under direct vision. All skin incisions  were infiltrated with a local anesthetic agent  before making the incision and placing the trocars.   The patient was positioned  in reverse Trendelenburg, tilted slightly to the patient's left.  The gallbladder was identified, the fundus grasped and retracted cephalad. Adhesions were lysed bluntly. The infundibulum was grasped and retracted laterally, exposing the peritoneum overlying the triangle of Calot. This was then divided and exposed in a blunt fashion. A critical view of the cystic duct and cystic artery was obtained.  The cystic duct was clearly identified and bluntly dissected.   The cystic duct and artery were very easily serially clipped and cut with endoscopic shears. The gallbladder was taken from the gallbladder fossa in a retrograde fashion with the electrocautery. The gallbladder was removed and placed in an Endocatch bag. There was a small amount of bile spillage during the dissection along the posterior gallbladder wall. The liver bed was irrigated and inspected. Hemostasis was achieved with the electrocautery. Copious irrigation was utilized and was repeatedly aspirated until clear.  The gallbladder and Endocatch sac were then removed through the epigastric port site.   The epigastric port site had to be bluntly enlarged to allow for the gallbladder be removed. Inspection of the right upper quadrant was performed. No bleeding, bile duct injury or leak, or bowel injury was noted. Pneumoperitoneum was released.  The epigastric port site was closed with figure-of-eight 0 Vicryl sutures. 4-0 subcuticular Monocryl was used to close the skin. Steristrips and Mastisol and sterile dressings were  applied.  The patient was then extubated and brought to the recovery room in stable condition. Sponge, lap, and needle counts were correct at closure and at the conclusion of the case.   Findings: Previous Cholecystitis   Estimated Blood Loss: 10 mL         Drains: None  Specimens: Gallbladder           Complications: none                Krishay Faro T. Adonis Huguenin, MD, FACS

## 2016-08-09 NOTE — Interval H&P Note (Signed)
History and Physical Interval Note:  08/09/2016 12:53 PM  Victor Cuevas  has presented today for surgery, with the diagnosis of CHOLELITHIASIS  The various methods of treatment have been discussed with the patient and family. After consideration of risks, benefits and other options for treatment, the patient has consented to  Procedure(s): LAPAROSCOPIC CHOLECYSTECTOMY (N/A) as a surgical intervention .  The patient's history has been reviewed, patient examined, no change in status, stable for surgery.  I have reviewed the patient's chart and labs.  Questions were answered to the patient's satisfaction.     Clayburn Pert

## 2016-08-09 NOTE — Transfer of Care (Signed)
Immediate Anesthesia Transfer of Care Note  Patient: Victor Cuevas  Procedure(s) Performed: Procedure(s): LAPAROSCOPIC CHOLECYSTECTOMY (N/A)  Patient Location: PACU  Anesthesia Type:General  Level of Consciousness: sedated  Airway & Oxygen Therapy: Patient Spontanous Breathing and Patient connected to nasal cannula oxygen  Post-op Assessment: Report given to RN and Post -op Vital signs reviewed and stable  Post vital signs: Reviewed and stable  Last Vitals:  Vitals:   08/09/16 1248 08/09/16 1545  BP: 99/78 120/71  Pulse: 91 60  Resp: 18 16  Temp: 37.5 C (P) 37 C    Last Pain:  Vitals:   08/09/16 1545  TempSrc:   PainSc: (P) 0-No pain         Complications: No apparent anesthesia complications

## 2016-08-10 ENCOUNTER — Encounter: Payer: Self-pay | Admitting: General Surgery

## 2016-08-10 NOTE — Anesthesia Postprocedure Evaluation (Signed)
Anesthesia Post Note  Patient: Victor Cuevas  Procedure(s) Performed: Procedure(s) (LRB): LAPAROSCOPIC CHOLECYSTECTOMY (N/A)  Patient location during evaluation: PACU Anesthesia Type: General Level of consciousness: awake and alert and oriented Pain management: pain level controlled Vital Signs Assessment: post-procedure vital signs reviewed and stable Respiratory status: spontaneous breathing Cardiovascular status: blood pressure returned to baseline Anesthetic complications: no     Last Vitals:  Vitals:   08/09/16 1829 08/09/16 1933  BP: (!) 101/49 119/67  Pulse: (!) 50 (!) 57  Resp: 12 14  Temp:      Last Pain:  Vitals:   08/10/16 0851  TempSrc:   PainSc: 1                  Luberta Grabinski

## 2016-08-11 LAB — SURGICAL PATHOLOGY

## 2017-05-24 ENCOUNTER — Ambulatory Visit: Payer: BLUE CROSS/BLUE SHIELD

## 2018-12-14 IMAGING — US US ABDOMEN LIMITED
1 series · 14 of 25 positions shown · non-contrast
Comparison: 05/22/2016

CLINICAL DATA: Epigastric pain

EXAM:
US ABDOMEN LIMITED - RIGHT UPPER QUADRANT

[Series 1: us abdomen limited · 0.20mm/px · 14 of 44 slices shown]
[im 1/44]
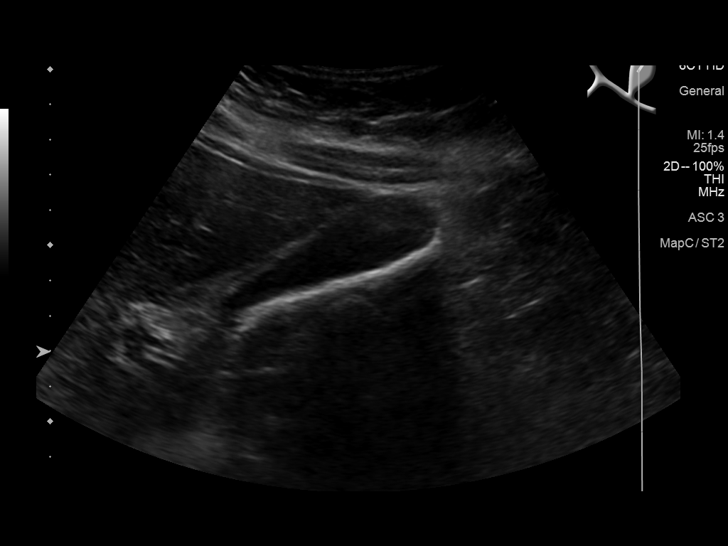
[im 4/44]
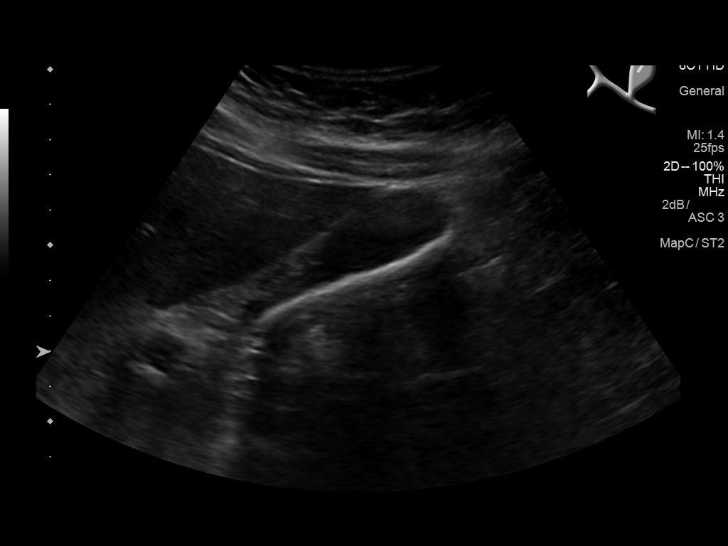
[im 8/44]
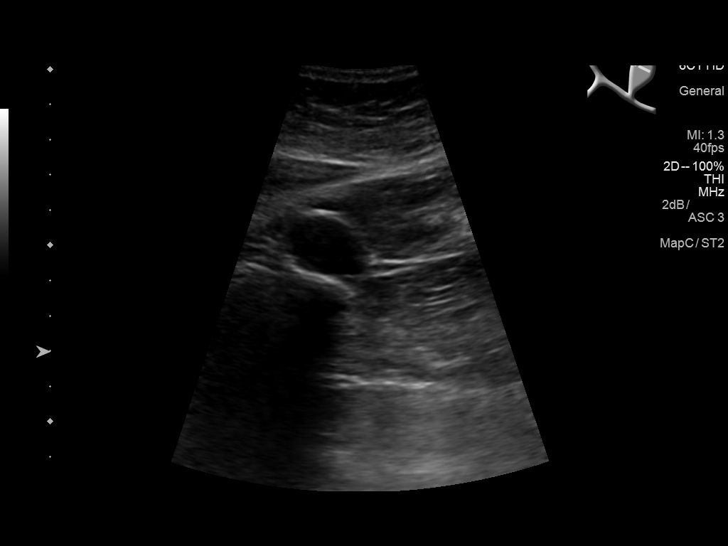
[im 11/44]
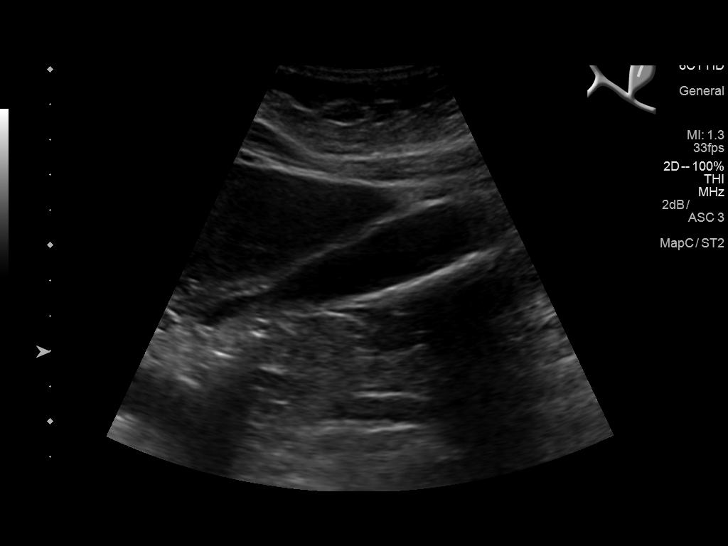
[im 15/44]
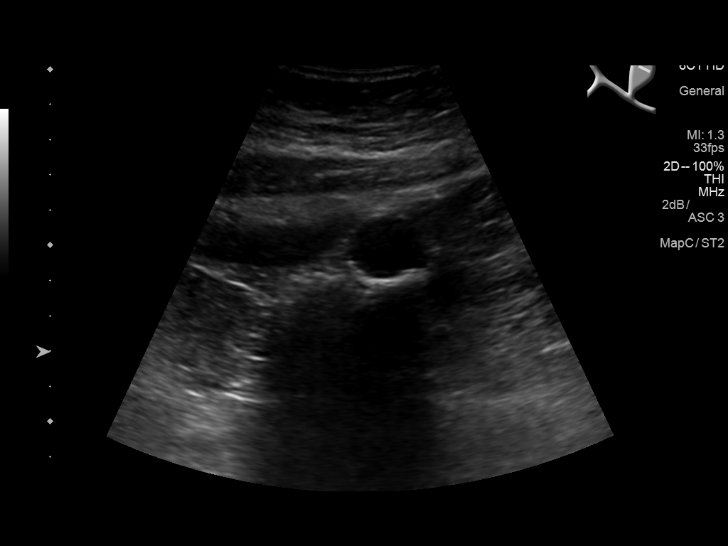
[im 17/44]
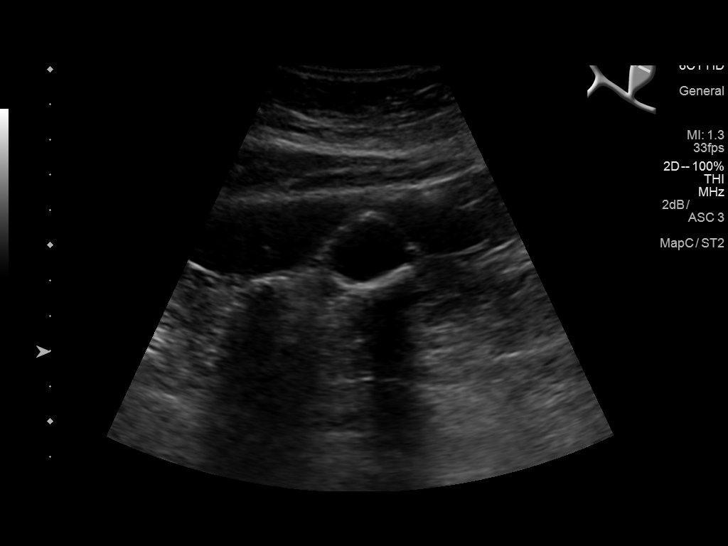
[im 20/44]
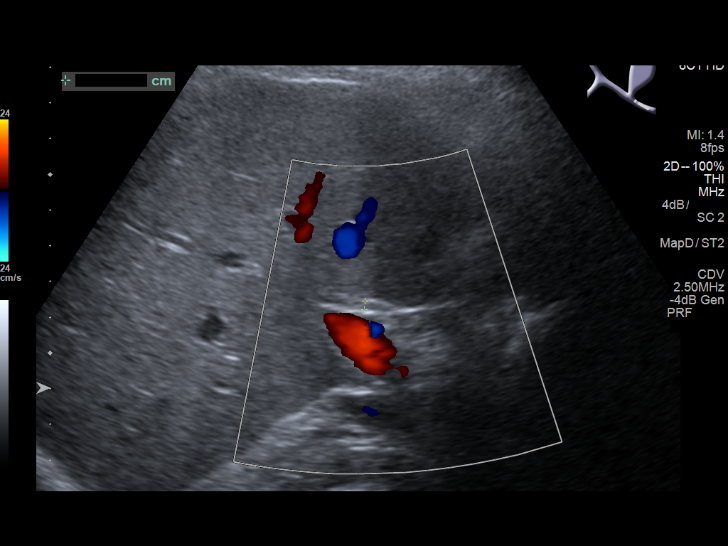
[im 24/44]
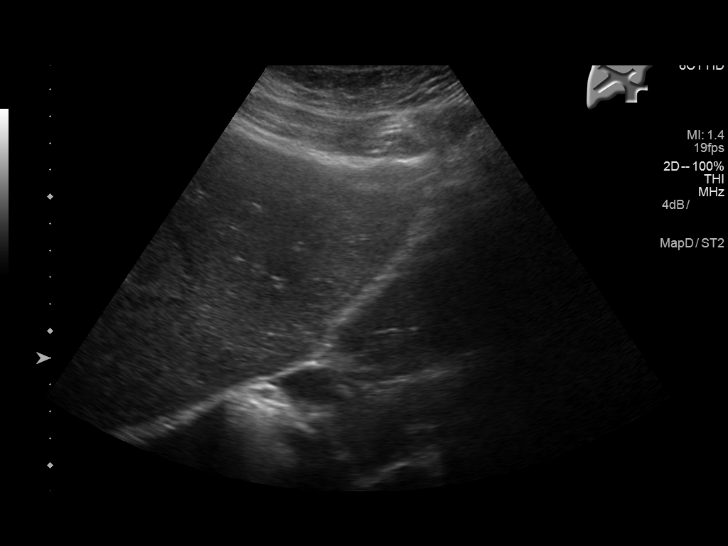
[im 27/44]
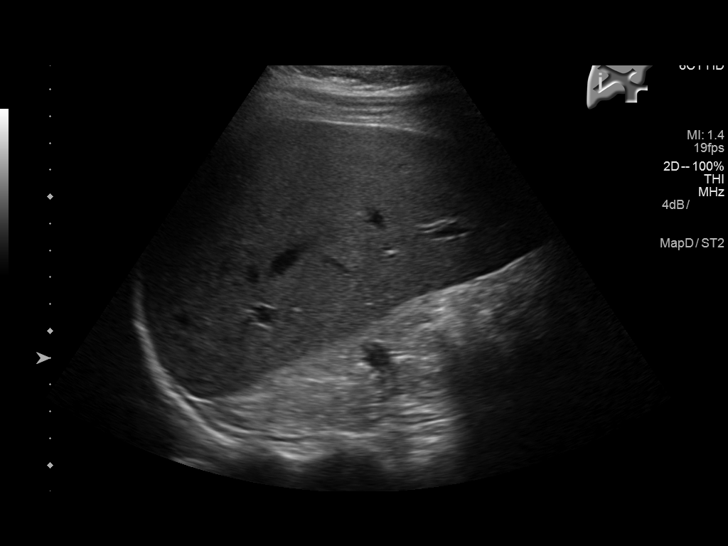
[im 29/44]
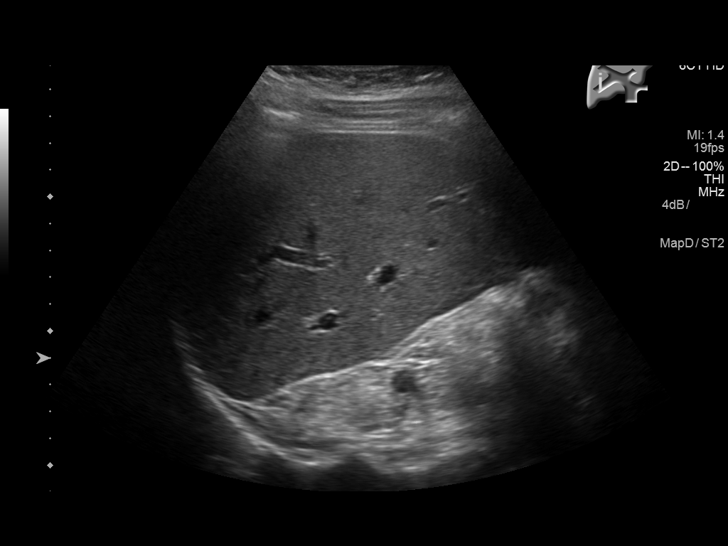
[im 33/44]
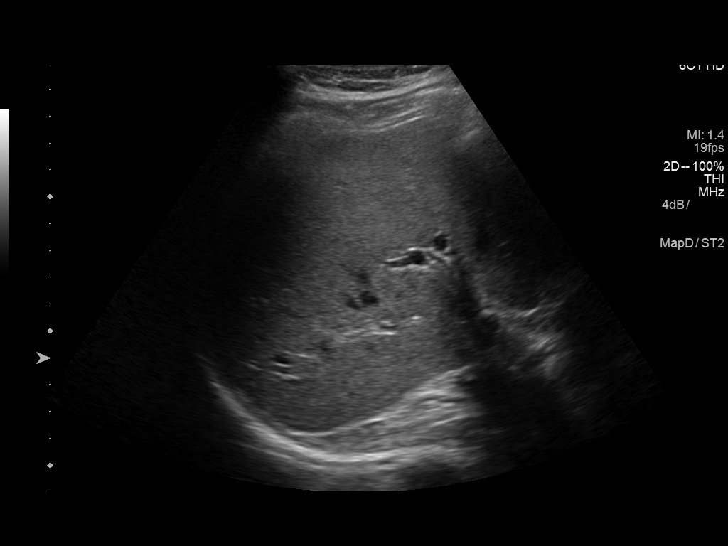
[im 36/44]
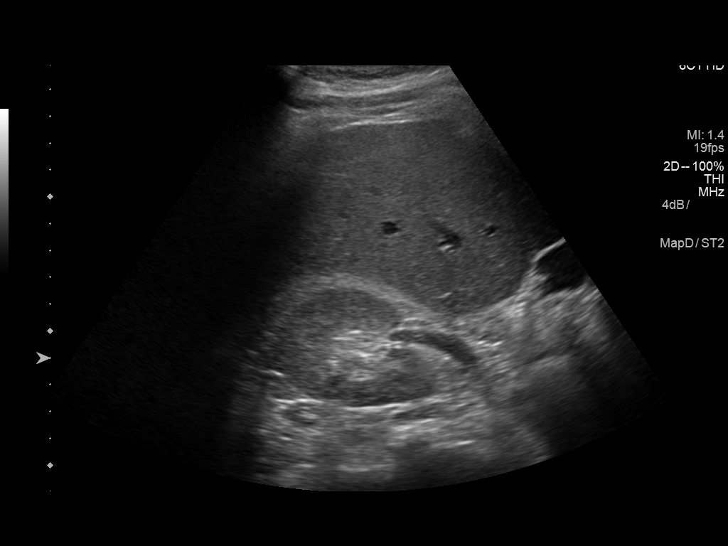
[im 40/44]
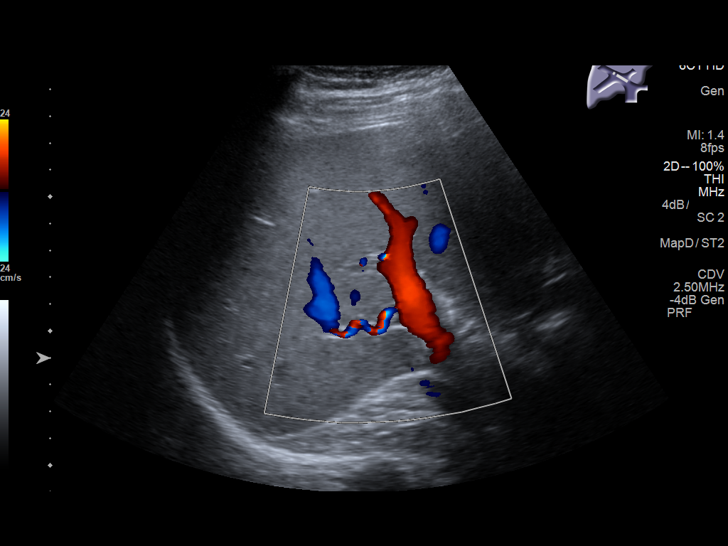
[im 44/44]
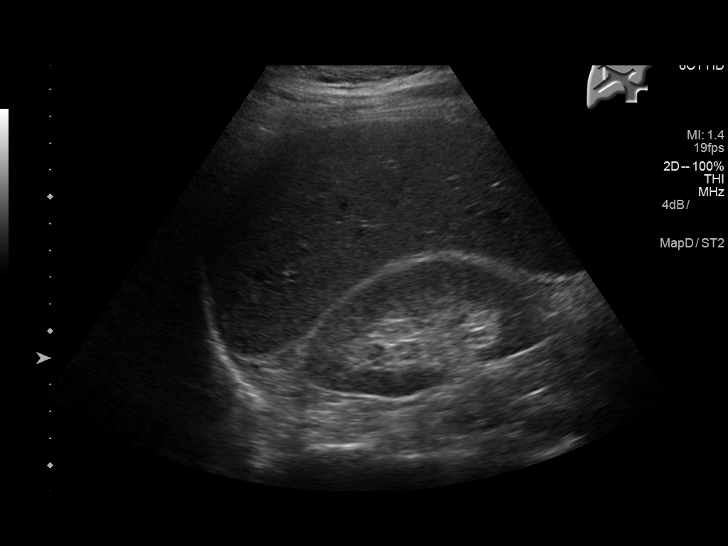

[14 of 25 positions shown; findings below may reference images not displayed]

FINDINGS: Gallbladder:

Multiple tiny gallstones are noted. No gallbladder wall thickening
or pericholecystic fluid is seen.

Common bile duct:

Diameter: 2.4 mm.

Liver:

No focal lesion identified. Within normal limits in parenchymal
echogenicity.
IMPRESSION: Tiny gallstones without complicating factors.

## 2019-12-25 ENCOUNTER — Other Ambulatory Visit: Payer: Self-pay

## 2019-12-25 ENCOUNTER — Ambulatory Visit: Payer: Self-pay

## 2019-12-25 DIAGNOSIS — Z20822 Contact with and (suspected) exposure to covid-19: Secondary | ICD-10-CM

## 2019-12-25 LAB — POC COVID19 BINAXNOW: SARS Coronavirus 2 Ag: NEGATIVE

## 2020-03-05 ENCOUNTER — Other Ambulatory Visit: Payer: Self-pay

## 2020-03-05 ENCOUNTER — Ambulatory Visit: Payer: Self-pay

## 2020-03-05 ENCOUNTER — Telehealth: Payer: Self-pay | Admitting: Nurse Practitioner

## 2020-03-05 DIAGNOSIS — U071 COVID-19: Secondary | ICD-10-CM

## 2020-03-05 LAB — POC COVID19 BINAXNOW: SARS Coronavirus 2 Ag: POSITIVE — AB

## 2020-03-05 NOTE — Patient Instructions (Signed)
COVID-19: What Your Test Results Mean If you test positive for COVID-19 Take steps to protect others regardless of your COVID-19 vaccination status Stay home.  Isolate at home for at least 10 days. Stay in a specific room and away from other people in your home. Get rest and stay hydrated. If you develop symptoms, continue to isolate for at least 10 days after symptoms began and until you do not have a fever without using medications to reduce fever. Stay in touch with your doctor. Contact your doctor as soon as possible if you are an older adult or have underlying medical conditions. Contact your doctor or health department about isolation if you  Are severely ill or have a weakened immune system.  Had a positive test result followed by a negative result.  Test positive for many weeks. If you test negative for COVID-19:  The virus was not detected. If you have symptoms of COVID-19:  You may have received a false negative test result and still might have COVID-19.  Isolate from others. If you do not have symptoms of COVID-19 and you were exposed to a person with COVID-19:  You are likely not infected, but you still may get sick.  Contact your doctor about your symptoms, about follow-up testing, and how long to isolate.  Self-quarantine for 14 days at home after your exposure.  If you are fully vaccinated, you do not need to self quarantine.  Contact your doctor or local health department regarding options to reduce the length of your quarantine. A negative test result does not mean you won't get sick later. [email or web address] 11/18/2019 This information is not intended to replace advice given to you by your health care provider. Make sure you discuss any questions you have with your health care provider. Document Revised: 12/22/2019 Document Reviewed: 12/22/2019 Elsevier Patient Education  2021 [employer].

## 2020-03-05 NOTE — Progress Notes (Signed)
   Subjective:    Patient ID: Victor Cuevas, male    DOB: 05-Dec-1980, 40 y.o.   MRN: [id number]  HPI   40 year old male presents to clinic today for COVID testing.   He has not been vaccinated for COVID-19.    Started to have symptoms 03/02/20 at night Congestion fever no cough symptoms are mostly sinus congestion today.   He has been exposed to two other coworkers that have tested positive.   Denies underlying conditions.   Denies a personal history of COVID     Review of Systems  Constitutional: Positive for fever.  HENT: Positive for congestion and rhinorrhea.   Eyes: Negative.   Respiratory: Positive for cough.   Cardiovascular: Negative.   Gastrointestinal: Negative.   Genitourinary: Negative.   Musculoskeletal: Negative.   Neurological: Negative.        Objective:   Physical Exam  This was a telehealth appointment with patient after nurse visit for COVID-19 testing.   He was in no acute distress at time of nurse visit or during phone conversation with provider  Recent Results (from the past 2160 hour(s))  POC COVID-19     Status: Normal   Collection Time: 12/25/19 11:43 AM  Result Value Ref Range   SARS Coronavirus 2 Ag Negative Negative    Comment: [place] aware of negative POC result.  POC COVID-19     Status: Abnormal   Collection Time: 03/05/20 12:42 PM  Result Value Ref Range   SARS Coronavirus 2 Ag Positive (A) Negative       Assessment & Plan:  Discussed treatment options including referral for monoclonal antibodies or antiviral management. Patient declines at this time and will follow up if symptoms persist or with new concerns.   Will continue to manage symptoms with over the counter medications as discussed  Will seek immediate medical attention for any new or worsening symptoms over the weekend.   His return to work is 03/07/20 as long as symptoms are improving. He will otherwise schedule a follow up for extended release from work.  Diligent mask wearing should continue for 10 days from time of illness onset.   Additional education will be sent through patient mychart

## 2020-10-04 ENCOUNTER — Ambulatory Visit: Payer: BLUE CROSS/BLUE SHIELD | Admitting: Nurse Practitioner

## 2020-12-16 ENCOUNTER — Encounter: Payer: Self-pay | Admitting: Nurse Practitioner

## 2020-12-16 ENCOUNTER — Other Ambulatory Visit: Payer: Self-pay

## 2020-12-16 ENCOUNTER — Ambulatory Visit: Payer: Self-pay | Admitting: Nurse Practitioner

## 2020-12-16 VITALS — BP 140/108 | HR 97 | Temp 98.1°F | Resp 18 | Ht 71.0 in | Wt 238.0 lb

## 2020-12-16 DIAGNOSIS — J069 Acute upper respiratory infection, unspecified: Secondary | ICD-10-CM

## 2020-12-16 LAB — POC COVID19 BINAXNOW: SARS Coronavirus 2 Ag: NEGATIVE

## 2020-12-16 NOTE — Progress Notes (Signed)
   Subjective:    Patient ID: Victor Cuevas, male    DOB: 1980/08/10, 40 y.o.   MRN: [id number]  HPI  40 year old male presenting to [facility] with complaints of sore throat sinus pressure and HA, today is day 3 of symptoms. He had to call out of work this week due to illness.   Young children at home with URI symptoms as well no known contact with flu or COVID.   He had COVID 02/2020 of this year  Does not have a history of COVID vaccine.   Was using OTC decongestant for relief of symptoms.       Review of Systems  HENT:  Positive for congestion, rhinorrhea and sore throat.   Respiratory:  Positive for cough.   Cardiovascular: Negative.   Gastrointestinal: Negative.   Genitourinary: Negative.   Musculoskeletal: Negative.   Neurological: Negative.   Hematological: Negative.       Objective:   Physical Exam Constitutional:      Appearance: He is ill-appearing.  HENT:     Head: Normocephalic.     Right Ear: Hearing normal. A middle ear effusion is present.     Left Ear: Hearing normal. A middle ear effusion is present.     Ears:     Comments: No erythema or inflammation     Nose: Congestion present.     Mouth/Throat:     Mouth: Mucous membranes are moist.     Pharynx: Posterior oropharyngeal erythema present.  Eyes:     Pupils: Pupils are equal, round, and reactive to light.  Cardiovascular:     Rate and Rhythm: Normal rate and regular rhythm.     Heart sounds: Normal heart sounds.  Pulmonary:     Effort: Pulmonary effort is normal.     Breath sounds: Normal breath sounds.  Musculoskeletal:     Cervical back: Normal range of motion.  Skin:    General: Skin is warm.  Neurological:     General: No focal deficit present.     Mental Status: He is alert.    Recent Results (from the past 2160 hour(s))  POC COVID-19     Status: Normal   Collection Time: 12/16/20  9:25 AM  Result Value Ref Range   SARS Coronavirus 2 Ag Negative Negative         Assessment & Plan:  1. Viral upper respiratory tract infection Continue to manage with over the counter medications Dayquil or Mucinex as discussed follow package instructions  Push fluids and rest RTC if not improving  OK to return to work tomorrow - [place] COVID-19 (negative)

## 2020-12-22 ENCOUNTER — Ambulatory Visit: Payer: Self-pay | Admitting: Nurse Practitioner
